# Patient Record
Sex: Male | Born: 1990 | Race: White | Hispanic: No | Marital: Single | Smoking: Never smoker
Health system: Southern US, Community
[De-identification: ages and names within clinical notes are randomized; demographics above are authoritative.]

## PROBLEM LIST (undated history)

## (undated) DIAGNOSIS — K219 Gastro-esophageal reflux disease without esophagitis: Secondary | ICD-10-CM

## (undated) DIAGNOSIS — D649 Anemia, unspecified: Secondary | ICD-10-CM

## (undated) DIAGNOSIS — G8929 Other chronic pain: Secondary | ICD-10-CM

## (undated) DIAGNOSIS — Z87442 Personal history of urinary calculi: Secondary | ICD-10-CM

## (undated) DIAGNOSIS — R51 Headache: Secondary | ICD-10-CM

## (undated) DIAGNOSIS — I10 Essential (primary) hypertension: Secondary | ICD-10-CM

## (undated) DIAGNOSIS — K922 Gastrointestinal hemorrhage, unspecified: Secondary | ICD-10-CM

## (undated) DIAGNOSIS — F329 Major depressive disorder, single episode, unspecified: Secondary | ICD-10-CM

## (undated) DIAGNOSIS — R519 Headache, unspecified: Secondary | ICD-10-CM

## (undated) DIAGNOSIS — G43909 Migraine, unspecified, not intractable, without status migrainosus: Secondary | ICD-10-CM

## (undated) DIAGNOSIS — F419 Anxiety disorder, unspecified: Secondary | ICD-10-CM

## (undated) DIAGNOSIS — E669 Obesity, unspecified: Secondary | ICD-10-CM

## (undated) DIAGNOSIS — J189 Pneumonia, unspecified organism: Secondary | ICD-10-CM

## (undated) DIAGNOSIS — J45909 Unspecified asthma, uncomplicated: Secondary | ICD-10-CM

## (undated) DIAGNOSIS — Z9109 Other allergy status, other than to drugs and biological substances: Secondary | ICD-10-CM

## (undated) DIAGNOSIS — R079 Chest pain, unspecified: Secondary | ICD-10-CM

## (undated) DIAGNOSIS — IMO0001 Reserved for inherently not codable concepts without codable children: Secondary | ICD-10-CM

## (undated) DIAGNOSIS — F32A Depression, unspecified: Secondary | ICD-10-CM

## (undated) DIAGNOSIS — F319 Bipolar disorder, unspecified: Secondary | ICD-10-CM

## (undated) HISTORY — PX: FRACTURE SURGERY: SHX138

## (undated) HISTORY — PX: GASTRIC BYPASS: SHX52

---
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
## 2014-11-14 ENCOUNTER — Encounter: Payer: Self-pay | Admitting: Nurse Practitioner

## 2014-11-28 ENCOUNTER — Ambulatory Visit: Payer: Self-pay | Admitting: Nurse Practitioner

## 2014-11-28 ENCOUNTER — Emergency Department (HOSPITAL_COMMUNITY)
Admission: EM | Admit: 2014-11-28 | Discharge: 2014-11-28 | Disposition: A | Discharge status: Home / Self Care | Payer: Medicaid Other | Attending: Emergency Medicine | Admitting: Emergency Medicine

## 2014-11-28 ENCOUNTER — Emergency Department (HOSPITAL_COMMUNITY): Payer: Medicaid Other

## 2014-11-28 ENCOUNTER — Encounter (HOSPITAL_COMMUNITY): Payer: Self-pay | Admitting: Emergency Medicine

## 2014-11-28 DIAGNOSIS — Y92410 Unspecified street and highway as the place of occurrence of the external cause: Secondary | ICD-10-CM | POA: Diagnosis not present

## 2014-11-28 DIAGNOSIS — Z7982 Long term (current) use of aspirin: Secondary | ICD-10-CM | POA: Diagnosis not present

## 2014-11-28 DIAGNOSIS — S42401A Unspecified fracture of lower end of right humerus, initial encounter for closed fracture: Secondary | ICD-10-CM | POA: Diagnosis not present

## 2014-11-28 DIAGNOSIS — E669 Obesity, unspecified: Secondary | ICD-10-CM | POA: Insufficient documentation

## 2014-11-28 DIAGNOSIS — Z79899 Other long term (current) drug therapy: Secondary | ICD-10-CM | POA: Insufficient documentation

## 2014-11-28 DIAGNOSIS — Y9389 Activity, other specified: Secondary | ICD-10-CM | POA: Diagnosis not present

## 2014-11-28 DIAGNOSIS — I10 Essential (primary) hypertension: Secondary | ICD-10-CM | POA: Insufficient documentation

## 2014-11-28 DIAGNOSIS — S42301A Unspecified fracture of shaft of humerus, right arm, initial encounter for closed fracture: Secondary | ICD-10-CM

## 2014-11-28 DIAGNOSIS — Y998 Other external cause status: Secondary | ICD-10-CM | POA: Insufficient documentation

## 2014-11-28 DIAGNOSIS — S4991XA Unspecified injury of right shoulder and upper arm, initial encounter: Secondary | ICD-10-CM | POA: Diagnosis present

## 2014-11-28 HISTORY — DX: Obesity, unspecified: E66.9

## 2014-11-28 HISTORY — DX: Essential (primary) hypertension: I10

## 2014-11-28 MED ORDER — ONDANSETRON 4 MG PO TBDP
8.0000 mg | ORAL_TABLET | Freq: Once | ORAL | Status: AC
  Administered 2014-11-28: 8 mg via ORAL
  Filled 2014-11-28: qty 2

## 2014-11-28 MED ORDER — HYDROCODONE-ACETAMINOPHEN 5-325 MG PO TABS: 1.0000 | ORAL_TABLET | Freq: Four times a day (QID) | ORAL | Status: DC | PRN

## 2014-11-28 MED ORDER — OXYCODONE-ACETAMINOPHEN 5-325 MG PO TABS
1.0000 | ORAL_TABLET | Freq: Once | ORAL | Status: AC
  Administered 2014-11-28: 1 via ORAL
  Filled 2014-11-28: qty 1

## 2014-11-28 NOTE — ED Notes (Signed)
Pt monitored by pulse ox and bp cuff. 

## 2014-11-28 NOTE — ED Notes (Signed)
Pt returned to exam room from xray 

## 2014-11-28 NOTE — ED Notes (Addendum)
R arm elevated. Pt nauseated.

## 2014-11-28 NOTE — ED Notes (Signed)
Pt log rolled and no tenderness to cervical or back.
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
## 2014-11-28 NOTE — Progress Notes (Signed)
Orthopedic Tech Progress Note Patient Details:  Howard [name] [birth year] [id number]  Ortho Devices Type of Ortho Device: Ace wrap, Sling immobilizer, Coapt Ortho Device/Splint Location: rue Ortho Device/Splint Interventions: Application   Criselda Starke 11/28/2014, 2:04 PM
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
## 2014-11-28 NOTE — Discharge Instructions (Signed)
Please follow-up with orthopedic specialist immediately for further evaluation and management. Please monitor for new or worsening signs or symptoms return immediately if any present

## 2014-11-28 NOTE — ED Notes (Signed)
Ortho tech and RN at bedside to splint.

## 2014-11-29 ENCOUNTER — Other Ambulatory Visit: Payer: Self-pay | Admitting: Orthopedic Surgery

## 2014-11-29 ENCOUNTER — Encounter (HOSPITAL_COMMUNITY): Payer: Self-pay | Admitting: *Deleted

## 2014-11-29 NOTE — Progress Notes (Signed)
Dr. Jacklynn Bue, anesthesia, made aware of pt C/O chest pain for 4 years; EKG DOS per MD.

## 2014-11-29 NOTE — Progress Notes (Signed)
Pt stated that he has been experiencing chest pain for the last 4 years and denies being under the care of a cardiologist, having a stress test , echo and cardiac cath done. Pt stated, " they said that I was too young to have any tests done but I had an EKG done about 2-3 years ago at Dr. Wooldridge Copa at [place]."  According to pt grandmother ( pt consented) , " he takes Aspirin sometimes at night when he has chest pain. " Pt made aware to stop NSAID's , otc vitamins and herbal medications." Please measure neck and include BMI to complete Sleep Apnea screening on DOS. " According to pt grandmother, pt no longer takes Jordan because " it didn't agree with him, it made him hallucinate."

## 2014-11-30 ENCOUNTER — Ambulatory Visit (HOSPITAL_COMMUNITY): Payer: Medicaid Other | Admitting: Anesthesiology

## 2014-11-30 ENCOUNTER — Encounter (HOSPITAL_COMMUNITY)
Admission: RE | Disposition: A | Discharge status: Home / Self Care | Payer: Self-pay | Source: Ambulatory Visit | Attending: Orthopedic Surgery

## 2014-11-30 ENCOUNTER — Encounter (HOSPITAL_COMMUNITY): Payer: Self-pay | Admitting: *Deleted

## 2014-11-30 ENCOUNTER — Observation Stay (HOSPITAL_COMMUNITY)
Admission: RE | Admit: 2014-11-30 | Discharge: 2014-12-01 | Disposition: A | Discharge status: Home / Self Care | Payer: Medicaid Other | Source: Ambulatory Visit | Attending: Orthopedic Surgery | Admitting: Orthopedic Surgery

## 2014-11-30 ENCOUNTER — Ambulatory Visit (HOSPITAL_COMMUNITY): Payer: Medicaid Other

## 2014-11-30 DIAGNOSIS — J45909 Unspecified asthma, uncomplicated: Secondary | ICD-10-CM | POA: Insufficient documentation

## 2014-11-30 DIAGNOSIS — K219 Gastro-esophageal reflux disease without esophagitis: Secondary | ICD-10-CM | POA: Insufficient documentation

## 2014-11-30 DIAGNOSIS — S42301A Unspecified fracture of shaft of humerus, right arm, initial encounter for closed fracture: Secondary | ICD-10-CM | POA: Diagnosis not present

## 2014-11-30 DIAGNOSIS — Z7982 Long term (current) use of aspirin: Secondary | ICD-10-CM | POA: Insufficient documentation

## 2014-11-30 DIAGNOSIS — I10 Essential (primary) hypertension: Secondary | ICD-10-CM | POA: Insufficient documentation

## 2014-11-30 DIAGNOSIS — Z6841 Body Mass Index (BMI) 40.0 and over, adult: Secondary | ICD-10-CM | POA: Diagnosis not present

## 2014-11-30 DIAGNOSIS — S42343A Displaced spiral fracture of shaft of humerus, unspecified arm, initial encounter for closed fracture: Secondary | ICD-10-CM

## 2014-11-30 DIAGNOSIS — Z23 Encounter for immunization: Secondary | ICD-10-CM | POA: Diagnosis not present

## 2014-11-30 DIAGNOSIS — S42341A Displaced spiral fracture of shaft of humerus, right arm, initial encounter for closed fracture: Secondary | ICD-10-CM | POA: Diagnosis present

## 2014-11-30 DIAGNOSIS — X58XXXA Exposure to other specified factors, initial encounter: Secondary | ICD-10-CM | POA: Insufficient documentation

## 2014-11-30 DIAGNOSIS — F319 Bipolar disorder, unspecified: Secondary | ICD-10-CM | POA: Insufficient documentation

## 2014-11-30 DIAGNOSIS — R079 Chest pain, unspecified: Secondary | ICD-10-CM | POA: Diagnosis not present

## 2014-11-30 DIAGNOSIS — Z79899 Other long term (current) drug therapy: Secondary | ICD-10-CM | POA: Diagnosis not present

## 2014-11-30 HISTORY — DX: Headache, unspecified: R51.9

## 2014-11-30 HISTORY — DX: Other chronic pain: G89.29

## 2014-11-30 HISTORY — DX: Headache: R51

## 2014-11-30 HISTORY — DX: Chest pain, unspecified: R07.9

## 2014-11-30 HISTORY — DX: Gastro-esophageal reflux disease without esophagitis: K21.9

## 2014-11-30 HISTORY — PX: ORIF HUMERUS FRACTURE: SHX2126

## 2014-11-30 HISTORY — DX: Other allergy status, other than to drugs and biological substances: Z91.09

## 2014-11-30 HISTORY — DX: Reserved for inherently not codable concepts without codable children: IMO0001

## 2014-11-30 LAB — BASIC METABOLIC PANEL
Anion gap: 10 (ref 5–15)
BUN: 12 mg/dL (ref 6–20)
CALCIUM: 9 mg/dL (ref 8.9–10.3)
CO2: 22 mmol/L (ref 22–32)
CREATININE: 0.98 mg/dL (ref 0.61–1.24)
Chloride: 103 mmol/L (ref 101–111)
GFR calc non Af Amer: >60 mL/min (ref >60)
Glucose, Bld: 98 mg/dL (ref 65–99)
POTASSIUM: 3.3 mmol/L — AB (ref 3.5–5.1)
Sodium: 135 mmol/L (ref 135–145)

## 2014-11-30 LAB — CBC
HEMATOCRIT: 40.2 % (ref 39.0–52.0)
Hemoglobin: 13.4 g/dL (ref 13.0–17.0)
MCH: 28.2 pg (ref 26.0–34.0)
MCHC: 33.3 g/dL (ref 30.0–36.0)
MCV: 84.5 fL (ref 78.0–100.0)
Platelets: 297 10*3/uL (ref 150–400)
RBC: 4.76 MIL/uL (ref 4.22–5.81)
RDW: 13.7 % (ref 11.5–15.5)
WBC: 15.2 10*3/uL — AB (ref 4.0–10.5)

## 2014-11-30 SURGERY — OPEN REDUCTION INTERNAL FIXATION (ORIF) PROXIMAL HUMERUS FRACTURE
Anesthesia: Regional | Site: Arm Upper | Laterality: Right

## 2014-11-30 MED ORDER — FENTANYL CITRATE (PF) 100 MCG/2ML IJ SOLN
INTRAMUSCULAR | Status: DC | PRN
  Administered 2014-11-30 (×4): 50 ug via INTRAVENOUS
  Administered 2014-11-30: 100 ug via INTRAVENOUS

## 2014-11-30 MED ORDER — METOCLOPRAMIDE HCL 5 MG/ML IJ SOLN
5.0000 mg | Freq: Three times a day (TID) | INTRAMUSCULAR | Status: DC | PRN
  Administered 2014-11-30: 10 mg via INTRAVENOUS

## 2014-11-30 MED ORDER — ASPIRIN EC 325 MG PO TBEC
325.0000 mg | DELAYED_RELEASE_TABLET | Freq: Two times a day (BID) | ORAL | Status: DC
  Administered 2014-11-30: 325 mg via ORAL
  Filled 2014-11-30: qty 1

## 2014-11-30 MED ORDER — ONDANSETRON HCL 4 MG/2ML IJ SOLN
4.0000 mg | Freq: Four times a day (QID) | INTRAMUSCULAR | Status: DC | PRN
  Administered 2014-11-30: 4 mg via INTRAVENOUS

## 2014-11-30 MED ORDER — PNEUMOCOCCAL VAC POLYVALENT 25 MCG/0.5ML IJ INJ
0.5000 mL | INJECTION | INTRAMUSCULAR | Status: AC
  Administered 2014-12-01: 0.5 mL via INTRAMUSCULAR
  Filled 2014-11-30: qty 0.5

## 2014-11-30 MED ORDER — METOCLOPRAMIDE HCL 5 MG PO TABS: 5.0000 mg | ORAL_TABLET | Freq: Three times a day (TID) | ORAL | Status: DC | PRN

## 2014-11-30 MED ORDER — PROMETHAZINE HCL 25 MG/ML IJ SOLN: 6.2500 mg | INTRAMUSCULAR | Status: DC | PRN

## 2014-11-30 MED ORDER — BISACODYL 5 MG PO TBEC: 5.0000 mg | DELAYED_RELEASE_TABLET | Freq: Every day | ORAL | Status: DC | PRN

## 2014-11-30 MED ORDER — CEFAZOLIN SODIUM-DEXTROSE 2-3 GM-% IV SOLR
2.0000 g | Freq: Four times a day (QID) | INTRAVENOUS | Status: AC
  Administered 2014-11-30 – 2014-12-01 (×3): 2 g via INTRAVENOUS
  Filled 2014-11-30 (×3): qty 50

## 2014-11-30 MED ORDER — BUPIVACAINE-EPINEPHRINE (PF) 0.5% -1:200000 IJ SOLN
INTRAMUSCULAR | Status: DC | PRN
  Administered 2014-11-30: 30 mL via PERINEURAL

## 2014-11-30 MED ORDER — PROPOFOL 10 MG/ML IV BOLUS
INTRAVENOUS | Status: AC
  Filled 2014-11-30: qty 20

## 2014-11-30 MED ORDER — MORPHINE SULFATE 2 MG/ML IJ SOLN: 2.0000 mg | INTRAMUSCULAR | Status: DC | PRN

## 2014-11-30 MED ORDER — KETOROLAC TROMETHAMINE 30 MG/ML IJ SOLN: 30.0000 mg | Freq: Once | INTRAMUSCULAR | Status: DC | PRN

## 2014-11-30 MED ORDER — HYDROCHLOROTHIAZIDE 25 MG PO TABS
25.0000 mg | ORAL_TABLET | Freq: Every day | ORAL | Status: DC
  Administered 2014-11-30 – 2014-12-01 (×2): 25 mg via ORAL
  Filled 2014-11-30 (×2): qty 1

## 2014-11-30 MED ORDER — FENTANYL CITRATE (PF) 250 MCG/5ML IJ SOLN
INTRAMUSCULAR | Status: AC
  Filled 2014-11-30: qty 5

## 2014-11-30 MED ORDER — NEOSTIGMINE METHYLSULFATE 10 MG/10ML IV SOLN
INTRAVENOUS | Status: DC | PRN
  Administered 2014-11-30: 3 mg via INTRAVENOUS

## 2014-11-30 MED ORDER — LACTATED RINGERS IV SOLN
INTRAVENOUS | Status: DC | PRN
  Administered 2014-11-30 (×2): via INTRAVENOUS

## 2014-11-30 MED ORDER — POLYETHYLENE GLYCOL 3350 17 G PO PACK: 17.0000 g | PACK | Freq: Every day | ORAL | Status: DC | PRN

## 2014-11-30 MED ORDER — DEXAMETHASONE SODIUM PHOSPHATE 4 MG/ML IJ SOLN
INTRAMUSCULAR | Status: DC | PRN
  Administered 2014-11-30: 8 mg via INTRAVENOUS

## 2014-11-30 MED ORDER — DOCUSATE SODIUM 100 MG PO CAPS
100.0000 mg | ORAL_CAPSULE | Freq: Two times a day (BID) | ORAL | Status: DC
  Administered 2014-11-30 – 2014-12-01 (×2): 100 mg via ORAL
  Filled 2014-11-30 (×2): qty 1

## 2014-11-30 MED ORDER — ONDANSETRON HCL 4 MG PO TABS: 4.0000 mg | ORAL_TABLET | Freq: Four times a day (QID) | ORAL | Status: DC | PRN

## 2014-11-30 MED ORDER — DEXAMETHASONE SODIUM PHOSPHATE 4 MG/ML IJ SOLN
INTRAMUSCULAR | Status: AC
  Filled 2014-11-30: qty 2

## 2014-11-30 MED ORDER — MIDAZOLAM HCL 2 MG/2ML IJ SOLN
INTRAMUSCULAR | Status: AC
  Filled 2014-11-30: qty 4

## 2014-11-30 MED ORDER — SODIUM CHLORIDE 0.9 % IV SOLN
INTRAVENOUS | Status: DC
  Administered 2014-11-30: 20:00:00 via INTRAVENOUS

## 2014-11-30 MED ORDER — ONDANSETRON HCL 4 MG/2ML IJ SOLN
INTRAMUSCULAR | Status: DC | PRN
  Administered 2014-11-30: 4 mg via INTRAVENOUS

## 2014-11-30 MED ORDER — ROCURONIUM BROMIDE 100 MG/10ML IV SOLN
INTRAVENOUS | Status: DC | PRN
  Administered 2014-11-30: 50 mg via INTRAVENOUS

## 2014-11-30 MED ORDER — PHENOL 1.4 % MT LIQD: 1.0000 | OROMUCOSAL | Status: DC | PRN

## 2014-11-30 MED ORDER — CEFAZOLIN SODIUM-DEXTROSE 2-3 GM-% IV SOLR: 2.0000 g | INTRAVENOUS | Status: DC

## 2014-11-30 MED ORDER — MIDAZOLAM HCL 5 MG/5ML IJ SOLN
INTRAMUSCULAR | Status: DC | PRN
  Administered 2014-11-30: 2 mg via INTRAVENOUS

## 2014-11-30 MED ORDER — OXYCODONE HCL 5 MG PO TABS: 5.0000 mg | ORAL_TABLET | Freq: Once | ORAL | Status: DC | PRN

## 2014-11-30 MED ORDER — OXYCODONE HCL 5 MG PO TABS
5.0000 mg | ORAL_TABLET | ORAL | Status: DC | PRN
  Administered 2014-11-30: 10 mg via ORAL
  Administered 2014-12-01: 5 mg via ORAL
  Administered 2014-12-01 (×2): 10 mg via ORAL
  Filled 2014-11-30 (×4): qty 2
  Filled 2014-11-30: qty 1

## 2014-11-30 MED ORDER — GLYCOPYRROLATE 0.2 MG/ML IJ SOLN
INTRAMUSCULAR | Status: DC | PRN
  Administered 2014-11-30: 0.4 mg via INTRAVENOUS

## 2014-11-30 MED ORDER — AMLODIPINE BESY-BENAZEPRIL HCL 5-10 MG PO CAPS: 1.0000 | ORAL_CAPSULE | Freq: Every day | ORAL | Status: DC

## 2014-11-30 MED ORDER — ASPIRIN EC 325 MG PO TBEC: 325.0000 mg | DELAYED_RELEASE_TABLET | Freq: Two times a day (BID) | ORAL | Status: DC

## 2014-11-30 MED ORDER — ACETAMINOPHEN 325 MG PO TABS: 650.0000 mg | ORAL_TABLET | Freq: Four times a day (QID) | ORAL | Status: DC | PRN

## 2014-11-30 MED ORDER — METOCLOPRAMIDE HCL 5 MG/ML IJ SOLN
INTRAMUSCULAR | Status: AC
  Filled 2014-11-30: qty 2

## 2014-11-30 MED ORDER — AMLODIPINE BESYLATE 5 MG PO TABS
5.0000 mg | ORAL_TABLET | Freq: Every day | ORAL | Status: DC
  Administered 2014-11-30 – 2014-12-01 (×2): 5 mg via ORAL
  Filled 2014-11-30 (×2): qty 1

## 2014-11-30 MED ORDER — BENAZEPRIL HCL 20 MG PO TABS
10.0000 mg | ORAL_TABLET | Freq: Every day | ORAL | Status: DC
  Administered 2014-11-30 – 2014-12-01 (×2): 10 mg via ORAL
  Filled 2014-11-30 (×2): qty 1

## 2014-11-30 MED ORDER — ALUMINUM HYDROXIDE GEL 320 MG/5ML PO SUSP
15.0000 mL | ORAL | Status: DC | PRN
  Filled 2014-11-30: qty 30

## 2014-11-30 MED ORDER — SUCCINYLCHOLINE CHLORIDE 20 MG/ML IJ SOLN
INTRAMUSCULAR | Status: DC | PRN
  Administered 2014-11-30: 120 mg via INTRAVENOUS

## 2014-11-30 MED ORDER — DIPHENHYDRAMINE HCL 12.5 MG/5ML PO ELIX: 12.5000 mg | ORAL_SOLUTION | ORAL | Status: DC | PRN

## 2014-11-30 MED ORDER — CEFAZOLIN SODIUM 1-5 GM-% IV SOLN
1.0000 g | INTRAVENOUS | Status: DC
  Filled 2014-11-30: qty 50

## 2014-11-30 MED ORDER — PANTOPRAZOLE SODIUM 40 MG PO TBEC
40.0000 mg | DELAYED_RELEASE_TABLET | Freq: Every day | ORAL | Status: DC
  Administered 2014-11-30 – 2014-12-01 (×2): 40 mg via ORAL
  Filled 2014-11-30 (×2): qty 1

## 2014-11-30 MED ORDER — OXYCODONE HCL 5 MG/5ML PO SOLN: 5.0000 mg | Freq: Once | ORAL | Status: DC | PRN

## 2014-11-30 MED ORDER — HYDROMORPHONE HCL 1 MG/ML IJ SOLN: 0.2500 mg | INTRAMUSCULAR | Status: DC | PRN

## 2014-11-30 MED ORDER — FLEET ENEMA 7-19 GM/118ML RE ENEM: 1.0000 | ENEMA | Freq: Once | RECTAL | Status: AC | PRN

## 2014-11-30 MED ORDER — 0.9 % SODIUM CHLORIDE (POUR BTL) OPTIME
TOPICAL | Status: DC | PRN
  Administered 2014-11-30: 1000 mL

## 2014-11-30 MED ORDER — LIDOCAINE HCL (CARDIAC) 20 MG/ML IV SOLN
INTRAVENOUS | Status: AC
  Filled 2014-11-30: qty 5

## 2014-11-30 MED ORDER — PROPOFOL 10 MG/ML IV BOLUS
INTRAVENOUS | Status: DC | PRN
  Administered 2014-11-30: 300 mg via INTRAVENOUS

## 2014-11-30 MED ORDER — MENTHOL 3 MG MT LOZG: 1.0000 | LOZENGE | OROMUCOSAL | Status: DC | PRN

## 2014-11-30 MED ORDER — POVIDONE-IODINE 7.5 % EX SOLN
Freq: Once | CUTANEOUS | Status: DC
  Filled 2014-11-30: qty 118

## 2014-11-30 MED ORDER — ROCURONIUM BROMIDE 50 MG/5ML IV SOLN
INTRAVENOUS | Status: AC
  Filled 2014-11-30: qty 1

## 2014-11-30 MED ORDER — ONDANSETRON HCL 4 MG/2ML IJ SOLN
INTRAMUSCULAR | Status: AC
  Filled 2014-11-30: qty 2

## 2014-11-30 MED ORDER — CEFAZOLIN SODIUM-DEXTROSE 2-3 GM-% IV SOLR
INTRAVENOUS | Status: AC
  Administered 2014-11-30: 2 g via INTRAVENOUS
  Administered 2014-11-30: 1 g via INTRAVENOUS
  Filled 2014-11-30: qty 50

## 2014-11-30 MED ORDER — ACETAMINOPHEN 650 MG RE SUPP: 650.0000 mg | Freq: Four times a day (QID) | RECTAL | Status: DC | PRN

## 2014-11-30 SURGICAL SUPPLY — 78 items
BANDAGE ELASTIC 4 VELCRO ST LF (GAUZE/BANDAGES/DRESSINGS) ×3 IMPLANT
BANDAGE ELASTIC 6 VELCRO ST LF (GAUZE/BANDAGES/DRESSINGS) ×3 IMPLANT
BIT DRILL 2.5X2.75 QC CALB (BIT) ×6 IMPLANT
BIT DRILL 3.5X5.5 QC CALB (BIT) ×3 IMPLANT
BIT DRILL CALIBRATED 2.7 (BIT) ×2 IMPLANT
BIT DRILL CALIBRATED 2.7MM (BIT) ×1
CHLORAPREP W/TINT 26ML (MISCELLANEOUS) ×3 IMPLANT
CLOSURE WOUND 1/2 X4 (GAUZE/BANDAGES/DRESSINGS)
COVER MAYO STAND STRL (DRAPES) ×3 IMPLANT
COVER SURGICAL LIGHT HANDLE (MISCELLANEOUS) ×6 IMPLANT
DRAPE C-ARM 42X72 X-RAY (DRAPES) ×3 IMPLANT
DRAPE EXTREMITY T 121X128X90 (DRAPE) ×3 IMPLANT
DRAPE IMP U-DRAPE 54X76 (DRAPES) IMPLANT
DRAPE INCISE IOBAN 66X45 STRL (DRAPES) ×3 IMPLANT
DRAPE OEC MINIVIEW 54X84 (DRAPES) ×3 IMPLANT
DRAPE PROXIMA HALF (DRAPES) ×3 IMPLANT
DRAPE SURG 17X23 STRL (DRAPES) ×3 IMPLANT
DRAPE U-SHAPE 47X51 STRL (DRAPES) ×3 IMPLANT
DRSG ADAPTIC 3X8 NADH LF (GAUZE/BANDAGES/DRESSINGS) ×3 IMPLANT
DRSG EMULSION OIL 3X3 NADH (GAUZE/BANDAGES/DRESSINGS) IMPLANT
DRSG MEPILEX BORDER 4X8 (GAUZE/BANDAGES/DRESSINGS) IMPLANT
DRSG PAD ABDOMINAL 8X10 ST (GAUZE/BANDAGES/DRESSINGS) ×3 IMPLANT
ELECT REM PT RETURN 9FT ADLT (ELECTROSURGICAL) ×3
ELECTRODE REM PT RTRN 9FT ADLT (ELECTROSURGICAL) ×1 IMPLANT
GAUZE SPONGE 4X4 12PLY STRL (GAUZE/BANDAGES/DRESSINGS) IMPLANT
GLOVE BIO SURGEON STRL SZ7 (GLOVE) ×3 IMPLANT
GLOVE BIO SURGEON STRL SZ7.5 (GLOVE) ×3 IMPLANT
GLOVE BIOGEL PI IND STRL 7.0 (GLOVE) ×1 IMPLANT
GLOVE BIOGEL PI IND STRL 8 (GLOVE) ×1 IMPLANT
GLOVE BIOGEL PI INDICATOR 7.0 (GLOVE) ×2
GLOVE BIOGEL PI INDICATOR 8 (GLOVE) ×2
GOWN STRL REUS W/ TWL LRG LVL3 (GOWN DISPOSABLE) ×3 IMPLANT
GOWN STRL REUS W/ TWL XL LVL3 (GOWN DISPOSABLE) ×1 IMPLANT
GOWN STRL REUS W/TWL LRG LVL3 (GOWN DISPOSABLE) ×6
GOWN STRL REUS W/TWL XL LVL3 (GOWN DISPOSABLE) ×2
KIT BASIN OR (CUSTOM PROCEDURE TRAY) ×3 IMPLANT
KIT ROOM TURNOVER OR (KITS) ×3 IMPLANT
LOOP VESSEL MAXI BLUE (MISCELLANEOUS) ×3 IMPLANT
MANIFOLD NEPTUNE II (INSTRUMENTS) ×3 IMPLANT
NDL SUT .5 MAYO 1.404X.05X (NEEDLE) ×1 IMPLANT
NDL SUT 2 .5 CRC MAYO 1.732X (NEEDLE) ×1 IMPLANT
NEEDLE 22X1 1/2 (OR ONLY) (NEEDLE) IMPLANT
NEEDLE MAYO TAPER (NEEDLE) ×4
NS IRRIG 1000ML POUR BTL (IV SOLUTION) ×3 IMPLANT
PACK SHOULDER (CUSTOM PROCEDURE TRAY) ×3 IMPLANT
PACK UNIVERSAL I (CUSTOM PROCEDURE TRAY) ×3 IMPLANT
PAD ABD 8X10 STRL (GAUZE/BANDAGES/DRESSINGS) ×6 IMPLANT
PAD ARMBOARD 7.5X6 YLW CONV (MISCELLANEOUS) ×6 IMPLANT
PAD CAST 4YDX4 CTTN HI CHSV (CAST SUPPLIES) ×2 IMPLANT
PADDING CAST COTTON 4X4 STRL (CAST SUPPLIES) ×4
PADDING CAST COTTON 6X4 STRL (CAST SUPPLIES) ×6 IMPLANT
PLATE HUMERUS 21H (Plate) ×3 IMPLANT
SCREW CORTICAL 3.5MM 24MM (Screw) ×12 IMPLANT
SCREW CORTICAL 3.5MM 26MM (Screw) ×6 IMPLANT
SCREW LOCK CORT STAR 3.5X16 (Screw) ×6 IMPLANT
SCREW LOCK CORT STAR 3.5X18 (Screw) ×3 IMPLANT
SCREW LOCK CORT STAR 3.5X22 (Screw) ×12 IMPLANT
SLING ARM IMMOBILIZER XL (CAST SUPPLIES) ×3 IMPLANT
SPLINT PLASTER CAST XFAST 5X30 (CAST SUPPLIES) ×1 IMPLANT
SPLINT PLASTER XFAST SET 5X30 (CAST SUPPLIES) ×2
SPONGE GAUZE 4X4 12PLY STER LF (GAUZE/BANDAGES/DRESSINGS) ×3 IMPLANT
SPONGE LAP 18X18 X RAY DECT (DISPOSABLE) ×15 IMPLANT
STAPLER VISISTAT 35W (STAPLE) ×3 IMPLANT
STRIP CLOSURE SKIN 1/2X4 (GAUZE/BANDAGES/DRESSINGS) IMPLANT
SUCTION FRAZIER TIP 10 FR DISP (SUCTIONS) ×6 IMPLANT
SUPPORT WRAP ARM LG (MISCELLANEOUS) ×3 IMPLANT
SUT BONE WAX W31G (SUTURE) IMPLANT
SUT FIBERWIRE #2 38 T-5 BLUE (SUTURE) ×6
SUT VIC AB 0 CT1 27 (SUTURE) ×4
SUT VIC AB 0 CT1 27XBRD ANBCTR (SUTURE) ×2 IMPLANT
SUT VIC AB 2-0 CT1 27 (SUTURE) ×8
SUT VIC AB 2-0 CT1 TAPERPNT 27 (SUTURE) ×4 IMPLANT
SUTURE FIBERWR #2 38 T-5 BLUE (SUTURE) ×2 IMPLANT
SYR CONTROL 10ML LL (SYRINGE) IMPLANT
TOWEL OR 17X24 6PK STRL BLUE (TOWEL DISPOSABLE) ×3 IMPLANT
TOWEL OR 17X26 10 PK STRL BLUE (TOWEL DISPOSABLE) ×3 IMPLANT
WATER STERILE IRR 1000ML POUR (IV SOLUTION) ×3 IMPLANT
YANKAUER SUCT BULB TIP NO VENT (SUCTIONS) ×3 IMPLANT

## 2014-11-30 NOTE — Anesthesia Postprocedure Evaluation (Signed)
  Anesthesia Post-op Note  Patient: Howard [name]  Procedure(s) Performed: Procedure(s): OPEN REDUCTION INTERNAL FIXATION (ORIF) RIGHT Distal HUMERUS FRACTURE (Right)  Patient Location: PACU  Anesthesia Type:GA combined with regional for post-op pain  Level of Consciousness: awake and alert   Airway and Oxygen Therapy: Patient Spontanous Breathing  Post-op Pain: none  Post-op Assessment: Post-op Vital signs reviewed              Post-op Vital Signs: Reviewed  Last Vitals:  Filed Vitals:   11/30/14 1310  BP: 137/61  Pulse: 122  Temp: 37.1 C  Resp: 18    Complications: No apparent anesthesia complications
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
## 2014-11-30 NOTE — Anesthesia Preprocedure Evaluation (Addendum)
Anesthesia Evaluation  Patient identified by MRN, date of birth, ID band Patient awake    Reviewed: Allergy & Precautions, NPO status , Patient's Chart, lab work & pertinent test results  History of Anesthesia Complications Negative for: history of anesthetic complications  Airway Mallampati: III  TM Distance: >3 FB Neck ROM: Full    Dental  (+) Dental Advisory Given, Teeth Intact   Pulmonary neg pulmonary ROS, shortness of breath and with exertion, asthma (as a child) ,  breath sounds clear to auscultation        Cardiovascular hypertension, Pt. on medications Rhythm:Regular Rate:Normal     Neuro/Psych  Headaches, PSYCHIATRIC DISORDERS Bipolar Disorder negative neurological ROS     GI/Hepatic Neg liver ROS, GERD-  Medicated and Controlled,  Endo/Other  Morbid obesity  Renal/GU negative Renal ROS     Musculoskeletal negative musculoskeletal ROS (+)   Abdominal   Peds negative pediatric ROS (+)  Hematology negative hematology ROS (+)   Anesthesia Other Findings   Reproductive/Obstetrics negative OB ROS                          Anesthesia Physical Anesthesia Plan  ASA: III  Anesthesia Plan: General and Regional   Post-op Pain Management: GA combined w/ Regional for post-op pain   Induction: Intravenous  Airway Management Planned: Oral ETT  Additional Equipment:   Intra-op Plan:   Post-operative Plan: Extubation in OR  Informed Consent: I have reviewed the patients History and Physical, chart, labs and discussed the procedure including the risks, benefits and alternatives for the proposed anesthesia with the patient or authorized representative who has indicated his/her understanding and acceptance.   Dental advisory given  Plan Discussed with: CRNA  Anesthesia Plan Comments:         Anesthesia Quick Evaluation

## 2014-11-30 NOTE — Anesthesia Procedure Notes (Signed)
Anesthesia Regional Block:  Supraclavicular block  Pre-Anesthetic Checklist: ,, timeout performed, Correct Patient, Correct Site, Correct Laterality, Correct Procedure, Correct Position, site marked, Risks and benefits discussed,  Surgical consent,  Pre-op evaluation,  At surgeon's request and post-op pain management  Laterality: Right  Prep: chloraprep       Needles:  Injection technique: Single-shot  Needle Type: Echogenic Stimulator Needle     Needle Length: 9cm 9 cm Needle Gauge: 21 and 21 G    Additional Needles:  Procedures: ultrasound guided (picture in chart) and nerve stimulator Supraclavicular block  Nerve Stimulator or Paresthesia:  Response: biceps, 0.5 mA,  Response: finger flexion, 0.5 mA,   Additional Responses:   Narrative:  Start time: 11/30/2014 7:10 AM End time: 11/30/2014 7:20 AM Injection made incrementally with aspirations every 5 mL.  Performed by: Personally  Anesthesiologist: Marcene Duos  Additional Notes: Risks, benefits and alternative to block explained extensively.  Patient tolerated procedure well, without complications.
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
## 2014-11-30 NOTE — Discharge Instructions (Signed)
Discharge Instructions after Open Shoulder Repair  A sling has been provided for you. Remain in your sling at all times. This includes sleeping in your sling.  Use ice on the shoulder intermittently over the first 48 hours after surgery.  Pain medicine has been prescribed for you.  Use your medicine liberally over the first 48 hours, and then you can begin to taper your use. You may take Extra Strength Tylenol or Tylenol only in place of the pain pills. DO NOT take ANY nonsteroidal anti-inflammatory pain medications: Advil, Motrin, Ibuprofen, Aleve, Naproxen or Naprosyn.  Keep splint on until your first visit with Dr. Ave Filter, it must stay dry. Wrap it with large bag if you need to shower. Take one aspirin, a day for 2 weeks after surgery, unless you have an aspirin sensitivity/ allergy or asthma.   Please call [phone] during normal business hours or [phone] after hours for any problems. Including the following:  - excessive redness of the incisions - drainage for more than 4 days - fever of more than 101.5 F  *Please note that pain medications will not be refilled after hours or on weekends.

## 2014-11-30 NOTE — Transfer of Care (Signed)
Immediate Anesthesia Transfer of Care Note  Patient: Howard [name]  Procedure(s) Performed: Procedure(s): OPEN REDUCTION INTERNAL FIXATION (ORIF) RIGHT Distal HUMERUS FRACTURE (Right)  Patient Location: PACU  Anesthesia Type:General and Regional  Level of Consciousness: awake, alert , oriented and patient cooperative  Airway & Oxygen Therapy: Patient Spontanous Breathing and Patient connected to face mask oxygen  Post-op Assessment: Report given to RN and Post -op Vital signs reviewed and stable  Post vital signs: Reviewed and stable  Last Vitals:  Filed Vitals:   11/30/14 0609  BP: 158/90  Pulse: 119  Temp: 36.7 C  Resp: 18    Complications: No apparent anesthesia complications

## 2014-12-01 ENCOUNTER — Encounter (HOSPITAL_COMMUNITY): Payer: Self-pay | Admitting: Orthopedic Surgery

## 2014-12-01 DIAGNOSIS — S42301A Unspecified fracture of shaft of humerus, right arm, initial encounter for closed fracture: Secondary | ICD-10-CM | POA: Diagnosis not present

## 2014-12-01 LAB — CBC
HCT: 36.3 % — ABNORMAL LOW (ref 39.0–52.0)
Hemoglobin: 12.1 g/dL — ABNORMAL LOW (ref 13.0–17.0)
MCH: 28.2 pg (ref 26.0–34.0)
MCHC: 33.3 g/dL (ref 30.0–36.0)
MCV: 84.6 fL (ref 78.0–100.0)
Platelets: 359 10*3/uL (ref 150–400)
RBC: 4.29 MIL/uL (ref 4.22–5.81)
RDW: 13.8 % (ref 11.5–15.5)
WBC: 19.4 10*3/uL — AB (ref 4.0–10.5)

## 2014-12-01 MED ORDER — OXYCODONE-ACETAMINOPHEN 5-325 MG PO TABS: 1.0000 | ORAL_TABLET | ORAL | Status: DC | PRN

## 2014-12-01 MED ORDER — DOCUSATE SODIUM 100 MG PO CAPS: 100.0000 mg | ORAL_CAPSULE | Freq: Three times a day (TID) | ORAL | Status: DC | PRN
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
## 2014-12-06 ENCOUNTER — Inpatient Hospital Stay (HOSPITAL_COMMUNITY)
Admission: EM | Admit: 2014-12-06 | Discharge: 2014-12-07 | DRG: 378 | Disposition: A | Discharge status: Home / Self Care | Payer: Medicaid Other | Attending: Oncology | Admitting: Oncology

## 2014-12-06 ENCOUNTER — Encounter (HOSPITAL_COMMUNITY): Payer: Self-pay | Admitting: Family Medicine

## 2014-12-06 DIAGNOSIS — G8929 Other chronic pain: Secondary | ICD-10-CM | POA: Diagnosis present

## 2014-12-06 DIAGNOSIS — K219 Gastro-esophageal reflux disease without esophagitis: Secondary | ICD-10-CM | POA: Diagnosis present

## 2014-12-06 DIAGNOSIS — I10 Essential (primary) hypertension: Secondary | ICD-10-CM | POA: Diagnosis present

## 2014-12-06 DIAGNOSIS — G43909 Migraine, unspecified, not intractable, without status migrainosus: Secondary | ICD-10-CM | POA: Diagnosis present

## 2014-12-06 DIAGNOSIS — Z8249 Family history of ischemic heart disease and other diseases of the circulatory system: Secondary | ICD-10-CM

## 2014-12-06 DIAGNOSIS — E869 Volume depletion, unspecified: Secondary | ICD-10-CM | POA: Diagnosis present

## 2014-12-06 DIAGNOSIS — K449 Diaphragmatic hernia without obstruction or gangrene: Secondary | ICD-10-CM | POA: Diagnosis present

## 2014-12-06 DIAGNOSIS — F319 Bipolar disorder, unspecified: Secondary | ICD-10-CM | POA: Diagnosis present

## 2014-12-06 DIAGNOSIS — Z9889 Other specified postprocedural states: Secondary | ICD-10-CM | POA: Diagnosis not present

## 2014-12-06 DIAGNOSIS — K922 Gastrointestinal hemorrhage, unspecified: Secondary | ICD-10-CM

## 2014-12-06 DIAGNOSIS — Z6841 Body Mass Index (BMI) 40.0 and over, adult: Secondary | ICD-10-CM | POA: Diagnosis not present

## 2014-12-06 DIAGNOSIS — Z79899 Other long term (current) drug therapy: Secondary | ICD-10-CM | POA: Diagnosis not present

## 2014-12-06 DIAGNOSIS — R000 Tachycardia, unspecified: Secondary | ICD-10-CM

## 2014-12-06 DIAGNOSIS — Z7982 Long term (current) use of aspirin: Secondary | ICD-10-CM | POA: Diagnosis not present

## 2014-12-06 DIAGNOSIS — K921 Melena: Secondary | ICD-10-CM | POA: Diagnosis present

## 2014-12-06 DIAGNOSIS — Z8711 Personal history of peptic ulcer disease: Secondary | ICD-10-CM | POA: Diagnosis not present

## 2014-12-06 DIAGNOSIS — R079 Chest pain, unspecified: Secondary | ICD-10-CM | POA: Diagnosis present

## 2014-12-06 DIAGNOSIS — S42401D Unspecified fracture of lower end of right humerus, subsequent encounter for fracture with routine healing: Secondary | ICD-10-CM

## 2014-12-06 DIAGNOSIS — J45909 Unspecified asthma, uncomplicated: Secondary | ICD-10-CM | POA: Diagnosis present

## 2014-12-06 DIAGNOSIS — S42301D Unspecified fracture of shaft of humerus, right arm, subsequent encounter for fracture with routine healing: Secondary | ICD-10-CM

## 2014-12-06 DIAGNOSIS — R12 Heartburn: Secondary | ICD-10-CM

## 2014-12-06 DIAGNOSIS — Z8659 Personal history of other mental and behavioral disorders: Secondary | ICD-10-CM

## 2014-12-06 HISTORY — DX: Bipolar disorder, unspecified: F31.9

## 2014-12-06 HISTORY — DX: Major depressive disorder, single episode, unspecified: F32.9

## 2014-12-06 HISTORY — DX: Depression, unspecified: F32.A

## 2014-12-06 HISTORY — DX: Anxiety disorder, unspecified: F41.9

## 2014-12-06 HISTORY — DX: Migraine, unspecified, not intractable, without status migrainosus: G43.909

## 2014-12-06 HISTORY — DX: Gastrointestinal hemorrhage, unspecified: K92.2

## 2014-12-06 HISTORY — DX: Unspecified asthma, uncomplicated: J45.909

## 2014-12-06 LAB — CBC
HCT: 39.2 % (ref 39.0–52.0)
HCT: 35.2 % — ABNORMAL LOW (ref 39.0–52.0)
HEMOGLOBIN: 12.7 g/dL — AB (ref 13.0–17.0)
HEMOGLOBIN: 11.6 g/dL — AB (ref 13.0–17.0)
MCH: 27.9 pg (ref 26.0–34.0)
MCH: 28.2 pg (ref 26.0–34.0)
MCHC: 32.4 g/dL (ref 30.0–36.0)
MCHC: 33 g/dL (ref 30.0–36.0)
MCV: 86.2 fL (ref 78.0–100.0)
MCV: 85.6 fL (ref 78.0–100.0)
PLATELETS: 416 10*3/uL — AB (ref 150–400)
Platelets: 341 10*3/uL (ref 150–400)
RBC: 4.55 MIL/uL (ref 4.22–5.81)
RBC: 4.11 MIL/uL — ABNORMAL LOW (ref 4.22–5.81)
RDW: 13.5 % (ref 11.5–15.5)
RDW: 13.6 % (ref 11.5–15.5)
WBC: 15.1 10*3/uL — ABNORMAL HIGH (ref 4.0–10.5)
WBC: 13 10*3/uL — ABNORMAL HIGH (ref 4.0–10.5)

## 2014-12-06 LAB — TYPE AND SCREEN
ABO/RH(D): O POS
Antibody Screen: NEGATIVE

## 2014-12-06 LAB — COMPREHENSIVE METABOLIC PANEL
ALBUMIN: 3.7 g/dL (ref 3.5–5.0)
ALK PHOS: 92 U/L (ref 38–126)
ALT: 50 U/L (ref 17–63)
ANION GAP: 10 (ref 5–15)
AST: 26 U/L (ref 15–41)
BUN: 10 mg/dL (ref 6–20)
CALCIUM: 9.5 mg/dL (ref 8.9–10.3)
CO2: 25 mmol/L (ref 22–32)
Chloride: 102 mmol/L (ref 101–111)
Creatinine, Ser: 0.92 mg/dL (ref 0.61–1.24)
GFR calc non Af Amer: >60 mL/min (ref >60)
GLUCOSE: 97 mg/dL (ref 65–99)
POTASSIUM: 3.9 mmol/L (ref 3.5–5.1)
Sodium: 137 mmol/L (ref 135–145)
TOTAL PROTEIN: 7.3 g/dL (ref 6.5–8.1)
Total Bilirubin: 0.4 mg/dL (ref 0.3–1.2)

## 2014-12-06 LAB — ABO/RH: ABO/RH(D): O POS

## 2014-12-06 LAB — POC OCCULT BLOOD, ED: Fecal Occult Bld: POSITIVE — AB

## 2014-12-06 LAB — PROTIME-INR
INR: 1.01 (ref 0.00–1.49)
PROTHROMBIN TIME: 13.5 s (ref 11.6–15.2)

## 2014-12-06 LAB — APTT: aPTT: 33 seconds (ref 24–37)

## 2014-12-06 MED ORDER — SODIUM CHLORIDE 0.9 % IV SOLN
INTRAVENOUS | Status: AC
  Administered 2014-12-06: 19:00:00 via INTRAVENOUS

## 2014-12-06 MED ORDER — SODIUM CHLORIDE 0.9 % IV SOLN: 250.0000 mL | INTRAVENOUS | Status: DC | PRN

## 2014-12-06 MED ORDER — PANTOPRAZOLE SODIUM 40 MG IV SOLR
40.0000 mg | Freq: Two times a day (BID) | INTRAVENOUS | Status: DC
  Administered 2014-12-06 – 2014-12-07 (×2): 40 mg via INTRAVENOUS
  Filled 2014-12-06 (×2): qty 40

## 2014-12-06 MED ORDER — SODIUM CHLORIDE 0.9 % IV BOLUS (SEPSIS)
1000.0000 mL | Freq: Once | INTRAVENOUS | Status: AC
  Administered 2014-12-06: 1000 mL via INTRAVENOUS

## 2014-12-06 MED ORDER — SODIUM CHLORIDE 0.9 % IJ SOLN: 3.0000 mL | INTRAMUSCULAR | Status: DC | PRN

## 2014-12-06 MED ORDER — SODIUM CHLORIDE 0.9 % IJ SOLN
3.0000 mL | Freq: Two times a day (BID) | INTRAMUSCULAR | Status: DC
  Administered 2014-12-07: 3 mL via INTRAVENOUS

## 2014-12-06 MED ORDER — OXYCODONE-ACETAMINOPHEN 5-325 MG PO TABS: 1.0000 | ORAL_TABLET | ORAL | Status: DC | PRN

## 2014-12-06 MED ORDER — PANTOPRAZOLE SODIUM 40 MG IV SOLR: 40.0000 mg | Freq: Two times a day (BID) | INTRAVENOUS | Status: DC

## 2014-12-06 MED ORDER — SODIUM CHLORIDE 0.9 % IJ SOLN
3.0000 mL | Freq: Two times a day (BID) | INTRAMUSCULAR | Status: DC
  Administered 2014-12-06: 3 mL via INTRAVENOUS
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
## 2014-12-06 NOTE — ED Notes (Addendum)
Pt here for rectal bleeding x 3 episodes. sts dark clots. denies any pain. sts light headed and nauseous.
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
## 2014-12-06 NOTE — Progress Notes (Signed)
Pt admitted to room 6E26 from Ed.  Pt alert and oriented. Denies pain at this time. Paged Internal medicine pt in room.
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
## 2014-12-07 ENCOUNTER — Inpatient Hospital Stay (HOSPITAL_COMMUNITY): Payer: Medicaid Other | Admitting: Anesthesiology

## 2014-12-07 ENCOUNTER — Encounter (HOSPITAL_COMMUNITY): Payer: Self-pay | Admitting: Anesthesiology

## 2014-12-07 ENCOUNTER — Encounter (HOSPITAL_COMMUNITY)
Admission: EM | Disposition: A | Discharge status: Home / Self Care | Payer: Self-pay | Source: Home / Self Care | Attending: Oncology

## 2014-12-07 HISTORY — PX: ESOPHAGOGASTRODUODENOSCOPY: SHX5428

## 2014-12-07 LAB — CBC
HCT: 40.2 % (ref 39.0–52.0)
HEMOGLOBIN: 13.1 g/dL (ref 13.0–17.0)
MCH: 28.1 pg (ref 26.0–34.0)
MCHC: 32.6 g/dL (ref 30.0–36.0)
MCV: 86.1 fL (ref 78.0–100.0)
Platelets: 393 10*3/uL (ref 150–400)
RBC: 4.67 MIL/uL (ref 4.22–5.81)
RDW: 13.9 % (ref 11.5–15.5)
WBC: 14.6 10*3/uL — ABNORMAL HIGH (ref 4.0–10.5)

## 2014-12-07 SURGERY — EGD (ESOPHAGOGASTRODUODENOSCOPY)
Anesthesia: Monitor Anesthesia Care

## 2014-12-07 MED ORDER — PROPOFOL INFUSION 10 MG/ML OPTIME
INTRAVENOUS | Status: DC | PRN
  Administered 2014-12-07: 50 ug/kg/min via INTRAVENOUS

## 2014-12-07 MED ORDER — PROPOFOL 10 MG/ML IV BOLUS
INTRAVENOUS | Status: DC | PRN
  Administered 2014-12-07: 20 mg via INTRAVENOUS

## 2014-12-07 MED ORDER — LIDOCAINE HCL (CARDIAC) 20 MG/ML IV SOLN
INTRAVENOUS | Status: DC | PRN
  Administered 2014-12-07: 50 mg via INTRAVENOUS

## 2014-12-07 MED ORDER — BUTAMBEN-TETRACAINE-BENZOCAINE 2-2-14 % EX AERO
INHALATION_SPRAY | CUTANEOUS | Status: DC | PRN
  Administered 2014-12-07: 2 via TOPICAL

## 2014-12-07 MED ORDER — BENZONATATE 100 MG PO CAPS
200.0000 mg | ORAL_CAPSULE | Freq: Once | ORAL | Status: AC
  Administered 2014-12-07: 200 mg via ORAL
  Filled 2014-12-07: qty 2

## 2014-12-07 MED ORDER — LACTATED RINGERS IV SOLN
INTRAVENOUS | Status: DC | PRN
  Administered 2014-12-07: 11:00:00 via INTRAVENOUS

## 2014-12-07 NOTE — Transfer of Care (Signed)
Immediate Anesthesia Transfer of Care Note  Patient: Howard [name]  Procedure(s) Performed: Procedure(s): ESOPHAGOGASTRODUODENOSCOPY (EGD) (N/A)  Patient Location: Endoscopy Unit  Anesthesia Type:MAC  Level of Consciousness: awake, alert , oriented and patient cooperative  Airway & Oxygen Therapy: Patient Spontanous Breathing and Patient connected to nasal cannula oxygen  Post-op Assessment: Report given to RN, Post -op Vital signs reviewed and stable and Patient moving all extremities  Post vital signs: Reviewed and stable  Last Vitals:  Filed Vitals:   12/07/14 1121  BP: 143/78  Pulse: 90  Temp:   Resp: 21    Complications: No apparent anesthesia complications
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
## 2014-12-07 NOTE — Anesthesia Preprocedure Evaluation (Addendum)
Anesthesia Evaluation  Patient identified by MRN, date of birth, ID band Patient awake    Reviewed: Allergy & Precautions, NPO status , Patient's Chart, lab work & pertinent test results  History of Anesthesia Complications Negative for: history of anesthetic complications  Airway Mallampati: III  TM Distance: >3 FB Neck ROM: Full    Dental  (+) Dental Advisory Given, Teeth Intact   Pulmonary neg pulmonary ROS, shortness of breath and with exertion, asthma (as a child) ,  breath sounds clear to auscultation        Cardiovascular hypertension, Pt. on medications Rhythm:Regular Rate:Normal     Neuro/Psych  Headaches, PSYCHIATRIC DISORDERS Bipolar Disorder negative neurological ROS     GI/Hepatic Neg liver ROS, GERD-  Medicated and Controlled,  Endo/Other  Morbid obesity  Renal/GU negative Renal ROS     Musculoskeletal negative musculoskeletal ROS (+)   Abdominal   Peds negative pediatric ROS (+)  Hematology negative hematology ROS (+)   Anesthesia Other Findings   Reproductive/Obstetrics negative OB ROS                            Anesthesia Physical  Anesthesia Plan  ASA: III  Anesthesia Plan: MAC   Post-op Pain Management:    Induction: Intravenous  Airway Management Planned: Natural Airway and Nasal Cannula  Additional Equipment:   Intra-op Plan:   Post-operative Plan: Extubation in OR  Informed Consent: I have reviewed the patients History and Physical, chart, labs and discussed the procedure including the risks, benefits and alternatives for the proposed anesthesia with the patient or authorized representative who has indicated his/her understanding and acceptance.   Dental advisory given  Plan Discussed with: CRNA  Anesthesia Plan Comments:         Anesthesia Quick Evaluation
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
## 2014-12-07 NOTE — Progress Notes (Signed)
Pt discharge instructions given, pt and family verbalized understanding.  VSS. Denies pain. Pt left floor ambulating accompanied by staff and family.

## 2014-12-07 NOTE — Anesthesia Postprocedure Evaluation (Signed)
  Anesthesia Post-op Note  Patient: Howard [name]  Procedure(s) Performed: Procedure(s): ESOPHAGOGASTRODUODENOSCOPY (EGD) (N/A)  Patient Location: PACU  Anesthesia Type:MAC  Level of Consciousness: awake, alert  and oriented  Airway and Oxygen Therapy: Patient Spontanous Breathing  Post-op Pain: none  Post-op Assessment: Post-op Vital signs reviewed              Post-op Vital Signs: Reviewed  Last Vitals:  Filed Vitals:   12/07/14 1121  BP: 143/78  Pulse: 90  Temp:   Resp: 21    Complications: No apparent anesthesia complications

## 2014-12-07 NOTE — Op Note (Signed)
[facility] [address] [place], [address]   ENDOSCOPY PROCEDURE REPORT  PATIENT: Howard [name], Howard [name]  MR#: [id number] BIRTHDATE: 1990/09/23 , 23  yrs. old GENDER: male ENDOSCOPIST: Wandalee Ferdinand, MD REFERRED BY: PROCEDURE DATE:  2014/12/21 PROCEDURE:   EGD ASA CLASS:      3 INDICATIONS: MEDICATIONS:  propofol per anesthesia TOPICAL ANESTHETIC:  DESCRIPTION OF PROCEDURE: After the risks benefits and alternatives of the procedure were thoroughly explained, informed consent was obtained.  The Pentax Gastroscope [id number] endoscope was introduced through the mouth and advanced to the second portion of the duodenum , Without limitations.  The instrument was slowly withdrawn as the mucosa was fully examined. Estimated blood loss is zero unless otherwise noted in this procedure report.  Findings  esophagus: Normal  stomach: small hiatal hernia otherwise normal  duodenum: Normal    The scope was then withdrawn from the patient and the procedure completed.  COMPLICATIONS: There were no immediate complications.  ENDOSCOPIC IMPRESSION: small hiatal hernia otherwise normal EGD   RECOMMENDATIONS: because of the gastrointestinal bleeding, I recommended a colonoscopy. the patient however wishes to go home and consider having this as an outpatient. from a GI standpoint he appears to be clinically stable. I would advance his diet, send him home, and he can follow-up as an outpatient for colonoscopy.   REPEAT EXAM:  eSignedWandalee Ferdinand, MD Dec 21, 2014 11:36 AM    CC:  CPT CODES: ICD CODES:  The ICD and CPT codes recommended by this software are interpretations from the data that the clinical staff has captured with the software.  The verification of the translation of this report to the ICD and CPT codes and modifiers is the sole responsibility of the health care institution and practicing physician where this report was generated.  [employer] will not be held responsible for the validity of the ICD and CPT codes included on this report.  AMA assumes no liability for data contained or not contained herein. CPT is a Publishing rights manager of the [employer].  PATIENT NAME:  Howard [name], Howard [name] MR#: [id number]
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
## 2014-12-07 NOTE — Progress Notes (Signed)
Pt gone down via bed for EGD. 

## 2014-12-08 ENCOUNTER — Encounter (HOSPITAL_COMMUNITY): Payer: Self-pay | Admitting: Gastroenterology

## 2014-12-18 ENCOUNTER — Other Ambulatory Visit: Payer: Self-pay | Admitting: Orthopedic Surgery

## 2014-12-18 DIAGNOSIS — M75102 Unspecified rotator cuff tear or rupture of left shoulder, not specified as traumatic: Secondary | ICD-10-CM

## 2014-12-29 ENCOUNTER — Ambulatory Visit
Admission: RE | Admit: 2014-12-29 | Discharge: 2014-12-29 | Disposition: A | Discharge status: Home / Self Care | Payer: Medicaid Other | Source: Ambulatory Visit | Attending: Orthopedic Surgery | Admitting: Orthopedic Surgery

## 2014-12-29 DIAGNOSIS — M75102 Unspecified rotator cuff tear or rupture of left shoulder, not specified as traumatic: Secondary | ICD-10-CM

## 2015-01-18 ENCOUNTER — Ambulatory Visit: Payer: Medicaid Other | Attending: Orthopedic Surgery | Admitting: Physical Therapy

## 2015-01-18 DIAGNOSIS — M25512 Pain in left shoulder: Secondary | ICD-10-CM

## 2015-01-18 DIAGNOSIS — M25511 Pain in right shoulder: Secondary | ICD-10-CM | POA: Insufficient documentation

## 2015-01-18 DIAGNOSIS — R531 Weakness: Secondary | ICD-10-CM | POA: Diagnosis present

## 2015-01-18 NOTE — Therapy (Signed)
[facility] [address] [place], [place], [address] Phone: [phone]   Fax:  [phone]  Physical Therapy Evaluation  Patient Details  Name: Howard [name] MRN: [id number] Date of Birth: 1990-05-03 Referring Provider:  Jones Broom, MD  Encounter Date: 01/18/2015      PT End of Session - 01/18/15 1435    Visit Number 1   Number of Visits 12   Date for PT Re-Evaluation 03/08/15   PT Start Time 0151   PT Stop Time 0225   PT Time Calculation (min) 34 min   Activity Tolerance Patient tolerated treatment well   Behavior During Therapy [facility] for tasks assessed/performed      Past Medical History  Diagnosis Date  . Obese   . Hypertension   . Shortness of breath dyspnea     with exertion  . Pollen allergies   . GERD (gastroesophageal reflux disease)   . Chronic chest pain   . Bipolar 1 disorder   . Anxiety   . Depression   . Childhood asthma   . Headache     "a few times/month" (12/06/2014)  . Migraine     "a few times/month" (12/06/2014)  . Acute lower GI bleeding hospitalized 12/06/2014    Past Surgical History  Procedure Laterality Date  . Orif humerus fracture Right 11/30/2014    Procedure: OPEN REDUCTION INTERNAL FIXATION (ORIF) RIGHT Distal HUMERUS FRACTURE;  Surgeon: Jones Broom, MD;  Location: [facility] OR;  Service: Orthopedics;  Laterality: Right;  . Fracture surgery    . Esophagogastroduodenoscopy N/A 12/07/2014    Procedure: ESOPHAGOGASTRODUODENOSCOPY (EGD);  Surgeon: Graylin Shiver, MD;  Location: [facility] ENDOSCOPY;  Service: Endoscopy;  Laterality: N/A;    There were no vitals filed for this visit.  Visit Diagnosis:  Right shoulder pain - Plan: PT plan of care cert/re-cert  Left shoulder pain - Plan: PT plan of care cert/re-cert  Weakness - Plan: PT plan of care cert/re-cert      Subjective Assessment - 01/18/15 1436    Currently in Pain? Yes   Pain Score 2    Pain Location Shoulder   Pain Orientation  Right;Left   Pain Descriptors / Indicators Aching   Pain Type --  Sub-acute.   Pain Onset More than a month ago   Pain Frequency Constant   Aggravating Factors  Movement.   Pain Relieving Factors Rest.            [facility] PT Assessment - 01/18/15 0001    Assessment   Medical Diagnosis S/p right shoulder ORIF.  Left shoulder dislocation.   Onset Date/Surgical Date --  11/28/14.   Precautions   Precautions None   Restrictions   Weight Bearing Restrictions No   Balance Screen   Has the patient fallen in the past 6 months No   Has the patient had a decrease in activity level because of a fear of falling?  No   Is the patient reluctant to leave their home because of a fear of falling?  No   Prior Function   Level of Independence Independent   Cognition   Overall Cognitive Status Within Functional Limits for tasks assessed   ROM / Strength   AROM / PROM / Strength AROM;Strength   AROM   Overall AROM Comments Left shoulder ER= 70 degrees.  Active left shoulder abduction produced hiking and superior migration her his left humeral head.  Normal right UE movement at shoulder; elbow; forearm and wrist/hand.   Strength  Overall Strength Comments Left shoulder IR/ER= 4/5 and abduction= 3-/5.   Palpation   Palpation comment No significant areas of palpable tenderness.                                PT Long Term Goals - 01/18/15 1442    PT LONG TERM GOAL #1   Title Ind with HEP.   Baseline No knowledge of appropriate ther ex.   Time 6   Period Weeks   Status New   PT LONG TERM GOAL #2   Title Left shoulder ER= 90 degrees.   Baseline 70 degrees.   Time 6   Period Weeks   Status New   PT LONG TERM GOAL #3   Title Perform ADL's with left shoulder pain not > 2/10.   Baseline Pain can rise to    PT LONG TERM GOAL #4   Title Left shoulder strength= 5/5 to increase stability for functional tasks.   Baseline Left shoulder strength grades 3-/5 and 4/5.   Time  6   Period Weeks               Plan - 01/18/15 1447    PT Treatment/Interventions Therapeutic activities;Therapeutic exercise;Patient/family education;Iontophoresis /ml Dexamethasone         Problem List Patient Active Problem List   Diagnosis Date Noted  . GI bleed 12/06/2014  . Hematochezia 12/06/2014  . Heartburn 12/06/2014  . Bleeding gastrointestinal   . Bipolar I disorder, most recent episode (or current) unspecified   . Essential hypertension   . Morbid obesity   . Displaced spiral fracture of shaft of right humerus 11/30/2014    Serrena Linderman, [place] MPT 01/18/2015, 2:49 PM  [facility] [address] [place], [place], [address] Phone: [phone]   Fax:  [phone]

## 2015-02-07 ENCOUNTER — Ambulatory Visit: Payer: Medicaid Other | Admitting: Physical Therapy

## 2015-02-09 ENCOUNTER — Encounter: Payer: Medicaid Other | Admitting: *Deleted

## 2015-02-13 ENCOUNTER — Encounter: Payer: Medicaid Other | Admitting: Physical Therapy
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
## 2015-10-23 ENCOUNTER — Other Ambulatory Visit: Payer: Self-pay | Admitting: Gastroenterology

## 2015-10-23 DIAGNOSIS — R1013 Epigastric pain: Secondary | ICD-10-CM

## 2015-10-29 ENCOUNTER — Ambulatory Visit
Admission: RE | Admit: 2015-10-29 | Discharge: 2015-10-29 | Disposition: A | Discharge status: Home / Self Care | Payer: Medicaid Other | Source: Ambulatory Visit | Attending: Gastroenterology | Admitting: Gastroenterology

## 2015-10-29 DIAGNOSIS — R1013 Epigastric pain: Secondary | ICD-10-CM

## 2016-06-03 DIAGNOSIS — J45909 Unspecified asthma, uncomplicated: Secondary | ICD-10-CM | POA: Diagnosis not present

## 2016-06-03 DIAGNOSIS — G4719 Other hypersomnia: Secondary | ICD-10-CM | POA: Diagnosis not present

## 2016-06-03 DIAGNOSIS — G4733 Obstructive sleep apnea (adult) (pediatric): Secondary | ICD-10-CM | POA: Diagnosis not present

## 2016-07-01 DIAGNOSIS — G4733 Obstructive sleep apnea (adult) (pediatric): Secondary | ICD-10-CM | POA: Diagnosis not present

## 2016-07-27 DIAGNOSIS — G4733 Obstructive sleep apnea (adult) (pediatric): Secondary | ICD-10-CM | POA: Diagnosis not present

## 2016-08-26 DIAGNOSIS — G4733 Obstructive sleep apnea (adult) (pediatric): Secondary | ICD-10-CM | POA: Diagnosis not present

## 2016-09-26 DIAGNOSIS — G4733 Obstructive sleep apnea (adult) (pediatric): Secondary | ICD-10-CM | POA: Diagnosis not present

## 2016-10-26 DIAGNOSIS — G4733 Obstructive sleep apnea (adult) (pediatric): Secondary | ICD-10-CM | POA: Diagnosis not present
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
## 2017-04-28 DIAGNOSIS — R0600 Dyspnea, unspecified: Secondary | ICD-10-CM | POA: Diagnosis not present

## 2017-11-25 DIAGNOSIS — M109 Gout, unspecified: Secondary | ICD-10-CM | POA: Diagnosis not present

## 2017-11-25 DIAGNOSIS — I10 Essential (primary) hypertension: Secondary | ICD-10-CM | POA: Diagnosis not present

## 2017-11-25 DIAGNOSIS — K219 Gastro-esophageal reflux disease without esophagitis: Secondary | ICD-10-CM | POA: Diagnosis not present

## 2017-11-25 DIAGNOSIS — Z0000 Encounter for general adult medical examination without abnormal findings: Secondary | ICD-10-CM | POA: Diagnosis not present

## 2017-11-25 DIAGNOSIS — E785 Hyperlipidemia, unspecified: Secondary | ICD-10-CM | POA: Diagnosis not present

## 2017-11-25 DIAGNOSIS — Z6841 Body Mass Index (BMI) 40.0 and over, adult: Secondary | ICD-10-CM | POA: Diagnosis not present

## 2017-11-25 DIAGNOSIS — F319 Bipolar disorder, unspecified: Secondary | ICD-10-CM | POA: Diagnosis not present

## 2017-11-27 DIAGNOSIS — M109 Gout, unspecified: Secondary | ICD-10-CM | POA: Diagnosis not present

## 2017-11-27 DIAGNOSIS — Z6841 Body Mass Index (BMI) 40.0 and over, adult: Secondary | ICD-10-CM | POA: Diagnosis not present

## 2017-11-27 DIAGNOSIS — I10 Essential (primary) hypertension: Secondary | ICD-10-CM | POA: Diagnosis not present

## 2017-11-27 DIAGNOSIS — Z1211 Encounter for screening for malignant neoplasm of colon: Secondary | ICD-10-CM | POA: Diagnosis not present

## 2017-12-18 DIAGNOSIS — F331 Major depressive disorder, recurrent, moderate: Secondary | ICD-10-CM | POA: Diagnosis not present

## 2018-01-26 DIAGNOSIS — S93601A Unspecified sprain of right foot, initial encounter: Secondary | ICD-10-CM | POA: Diagnosis not present

## 2018-01-26 DIAGNOSIS — S99921A Unspecified injury of right foot, initial encounter: Secondary | ICD-10-CM | POA: Diagnosis not present

## 2018-03-10 DIAGNOSIS — F411 Generalized anxiety disorder: Secondary | ICD-10-CM | POA: Diagnosis not present

## 2018-03-10 DIAGNOSIS — F319 Bipolar disorder, unspecified: Secondary | ICD-10-CM | POA: Diagnosis not present

## 2018-03-10 DIAGNOSIS — F4310 Post-traumatic stress disorder, unspecified: Secondary | ICD-10-CM | POA: Diagnosis not present

## 2018-06-25 DIAGNOSIS — F411 Generalized anxiety disorder: Secondary | ICD-10-CM | POA: Diagnosis not present

## 2018-06-25 DIAGNOSIS — F319 Bipolar disorder, unspecified: Secondary | ICD-10-CM | POA: Diagnosis not present

## 2018-06-25 DIAGNOSIS — F4310 Post-traumatic stress disorder, unspecified: Secondary | ICD-10-CM | POA: Diagnosis not present

## 2018-06-25 DIAGNOSIS — Z6841 Body Mass Index (BMI) 40.0 and over, adult: Secondary | ICD-10-CM | POA: Diagnosis not present

## 2018-08-03 DIAGNOSIS — M109 Gout, unspecified: Secondary | ICD-10-CM | POA: Diagnosis not present

## 2018-08-13 DIAGNOSIS — J302 Other seasonal allergic rhinitis: Secondary | ICD-10-CM | POA: Diagnosis not present

## 2018-08-31 DIAGNOSIS — F411 Generalized anxiety disorder: Secondary | ICD-10-CM | POA: Diagnosis not present

## 2018-08-31 DIAGNOSIS — F319 Bipolar disorder, unspecified: Secondary | ICD-10-CM | POA: Diagnosis not present

## 2018-08-31 DIAGNOSIS — F4310 Post-traumatic stress disorder, unspecified: Secondary | ICD-10-CM | POA: Diagnosis not present

## 2018-08-31 DIAGNOSIS — Z6841 Body Mass Index (BMI) 40.0 and over, adult: Secondary | ICD-10-CM | POA: Diagnosis not present

## 2019-01-24 DIAGNOSIS — Z23 Encounter for immunization: Secondary | ICD-10-CM | POA: Diagnosis not present

## 2019-01-24 DIAGNOSIS — E785 Hyperlipidemia, unspecified: Secondary | ICD-10-CM | POA: Diagnosis not present

## 2019-01-24 DIAGNOSIS — I10 Essential (primary) hypertension: Secondary | ICD-10-CM | POA: Diagnosis not present

## 2019-01-24 DIAGNOSIS — M1000 Idiopathic gout, unspecified site: Secondary | ICD-10-CM | POA: Diagnosis not present

## 2019-01-24 DIAGNOSIS — Z0000 Encounter for general adult medical examination without abnormal findings: Secondary | ICD-10-CM | POA: Diagnosis not present

## 2019-01-24 DIAGNOSIS — R112 Nausea with vomiting, unspecified: Secondary | ICD-10-CM | POA: Diagnosis not present

## 2019-01-24 DIAGNOSIS — F319 Bipolar disorder, unspecified: Secondary | ICD-10-CM | POA: Diagnosis not present

## 2019-01-24 DIAGNOSIS — K219 Gastro-esophageal reflux disease without esophagitis: Secondary | ICD-10-CM | POA: Diagnosis not present

## 2019-01-24 DIAGNOSIS — M545 Low back pain: Secondary | ICD-10-CM | POA: Diagnosis not present

## 2019-01-24 DIAGNOSIS — B079 Viral wart, unspecified: Secondary | ICD-10-CM | POA: Diagnosis not present

## 2019-01-24 DIAGNOSIS — J4520 Mild intermittent asthma, uncomplicated: Secondary | ICD-10-CM | POA: Diagnosis not present

## 2019-03-30 DIAGNOSIS — Z6841 Body Mass Index (BMI) 40.0 and over, adult: Secondary | ICD-10-CM | POA: Diagnosis not present

## 2019-03-30 DIAGNOSIS — F319 Bipolar disorder, unspecified: Secondary | ICD-10-CM | POA: Diagnosis not present

## 2019-03-30 DIAGNOSIS — F4310 Post-traumatic stress disorder, unspecified: Secondary | ICD-10-CM | POA: Diagnosis not present

## 2019-03-30 DIAGNOSIS — F411 Generalized anxiety disorder: Secondary | ICD-10-CM | POA: Diagnosis not present

## 2019-06-08 DIAGNOSIS — Z6841 Body Mass Index (BMI) 40.0 and over, adult: Secondary | ICD-10-CM | POA: Diagnosis not present

## 2019-06-08 DIAGNOSIS — F4310 Post-traumatic stress disorder, unspecified: Secondary | ICD-10-CM | POA: Diagnosis not present

## 2019-06-08 DIAGNOSIS — F411 Generalized anxiety disorder: Secondary | ICD-10-CM | POA: Diagnosis not present

## 2019-06-08 DIAGNOSIS — F319 Bipolar disorder, unspecified: Secondary | ICD-10-CM | POA: Diagnosis not present

## 2019-06-23 DIAGNOSIS — J4520 Mild intermittent asthma, uncomplicated: Secondary | ICD-10-CM | POA: Diagnosis not present

## 2019-06-23 DIAGNOSIS — Z6841 Body Mass Index (BMI) 40.0 and over, adult: Secondary | ICD-10-CM | POA: Diagnosis not present

## 2019-06-23 DIAGNOSIS — J302 Other seasonal allergic rhinitis: Secondary | ICD-10-CM | POA: Diagnosis not present

## 2019-06-23 DIAGNOSIS — K219 Gastro-esophageal reflux disease without esophagitis: Secondary | ICD-10-CM | POA: Diagnosis not present

## 2019-06-23 DIAGNOSIS — F319 Bipolar disorder, unspecified: Secondary | ICD-10-CM | POA: Diagnosis not present

## 2019-06-23 DIAGNOSIS — I10 Essential (primary) hypertension: Secondary | ICD-10-CM | POA: Diagnosis not present

## 2019-06-23 DIAGNOSIS — B079 Viral wart, unspecified: Secondary | ICD-10-CM | POA: Diagnosis not present

## 2019-08-10 DIAGNOSIS — Z23 Encounter for immunization: Secondary | ICD-10-CM | POA: Diagnosis not present

## 2019-09-07 DIAGNOSIS — Z23 Encounter for immunization: Secondary | ICD-10-CM | POA: Diagnosis not present

## 2019-11-23 DIAGNOSIS — F319 Bipolar disorder, unspecified: Secondary | ICD-10-CM | POA: Diagnosis not present

## 2019-11-23 DIAGNOSIS — F411 Generalized anxiety disorder: Secondary | ICD-10-CM | POA: Diagnosis not present

## 2019-11-23 DIAGNOSIS — Z6841 Body Mass Index (BMI) 40.0 and over, adult: Secondary | ICD-10-CM | POA: Diagnosis not present

## 2019-11-23 DIAGNOSIS — F4310 Post-traumatic stress disorder, unspecified: Secondary | ICD-10-CM | POA: Diagnosis not present

## 2020-03-27 DIAGNOSIS — F411 Generalized anxiety disorder: Secondary | ICD-10-CM | POA: Diagnosis not present

## 2020-03-27 DIAGNOSIS — F319 Bipolar disorder, unspecified: Secondary | ICD-10-CM | POA: Diagnosis not present

## 2020-03-27 DIAGNOSIS — F5105 Insomnia due to other mental disorder: Secondary | ICD-10-CM | POA: Diagnosis not present

## 2020-03-27 DIAGNOSIS — F4310 Post-traumatic stress disorder, unspecified: Secondary | ICD-10-CM | POA: Diagnosis not present

## 2020-03-27 DIAGNOSIS — Z6841 Body Mass Index (BMI) 40.0 and over, adult: Secondary | ICD-10-CM | POA: Diagnosis not present

## 2020-03-30 DIAGNOSIS — R197 Diarrhea, unspecified: Secondary | ICD-10-CM | POA: Diagnosis not present

## 2020-03-30 DIAGNOSIS — R059 Cough, unspecified: Secondary | ICD-10-CM | POA: Diagnosis not present

## 2020-03-30 DIAGNOSIS — R52 Pain, unspecified: Secondary | ICD-10-CM | POA: Diagnosis not present

## 2020-03-30 DIAGNOSIS — J40 Bronchitis, not specified as acute or chronic: Secondary | ICD-10-CM | POA: Diagnosis not present

## 2020-03-30 DIAGNOSIS — R519 Headache, unspecified: Secondary | ICD-10-CM | POA: Diagnosis not present

## 2020-04-10 DIAGNOSIS — Z23 Encounter for immunization: Secondary | ICD-10-CM | POA: Diagnosis not present

## 2020-09-12 DIAGNOSIS — F5105 Insomnia due to other mental disorder: Secondary | ICD-10-CM | POA: Diagnosis not present

## 2020-09-12 DIAGNOSIS — F411 Generalized anxiety disorder: Secondary | ICD-10-CM | POA: Diagnosis not present

## 2020-09-12 DIAGNOSIS — F4310 Post-traumatic stress disorder, unspecified: Secondary | ICD-10-CM | POA: Diagnosis not present

## 2020-09-12 DIAGNOSIS — F319 Bipolar disorder, unspecified: Secondary | ICD-10-CM | POA: Diagnosis not present

## 2021-02-19 ENCOUNTER — Ambulatory Visit: Payer: Self-pay | Admitting: Family Medicine

## 2021-02-26 DIAGNOSIS — R42 Dizziness and giddiness: Secondary | ICD-10-CM | POA: Diagnosis not present

## 2021-02-26 DIAGNOSIS — R000 Tachycardia, unspecified: Secondary | ICD-10-CM | POA: Diagnosis not present

## 2021-02-26 DIAGNOSIS — R519 Headache, unspecified: Secondary | ICD-10-CM | POA: Diagnosis not present

## 2021-02-26 DIAGNOSIS — I2699 Other pulmonary embolism without acute cor pulmonale: Secondary | ICD-10-CM | POA: Diagnosis not present

## 2021-02-26 DIAGNOSIS — R0689 Other abnormalities of breathing: Secondary | ICD-10-CM | POA: Diagnosis not present

## 2021-02-26 DIAGNOSIS — D72829 Elevated white blood cell count, unspecified: Secondary | ICD-10-CM | POA: Diagnosis not present

## 2021-02-26 DIAGNOSIS — I10 Essential (primary) hypertension: Secondary | ICD-10-CM | POA: Diagnosis not present

## 2021-02-26 DIAGNOSIS — R079 Chest pain, unspecified: Secondary | ICD-10-CM | POA: Diagnosis not present

## 2021-02-26 DIAGNOSIS — J42 Unspecified chronic bronchitis: Secondary | ICD-10-CM | POA: Diagnosis not present

## 2021-02-26 DIAGNOSIS — I4510 Unspecified right bundle-branch block: Secondary | ICD-10-CM | POA: Diagnosis not present

## 2021-02-26 DIAGNOSIS — I959 Hypotension, unspecified: Secondary | ICD-10-CM | POA: Diagnosis not present

## 2021-02-26 DIAGNOSIS — R0789 Other chest pain: Secondary | ICD-10-CM | POA: Diagnosis not present

## 2021-03-12 DIAGNOSIS — F319 Bipolar disorder, unspecified: Secondary | ICD-10-CM | POA: Diagnosis not present

## 2021-03-12 DIAGNOSIS — F5105 Insomnia due to other mental disorder: Secondary | ICD-10-CM | POA: Diagnosis not present

## 2021-03-12 DIAGNOSIS — F411 Generalized anxiety disorder: Secondary | ICD-10-CM | POA: Diagnosis not present

## 2021-03-12 DIAGNOSIS — Z6841 Body Mass Index (BMI) 40.0 and over, adult: Secondary | ICD-10-CM | POA: Diagnosis not present

## 2021-03-12 DIAGNOSIS — F4310 Post-traumatic stress disorder, unspecified: Secondary | ICD-10-CM | POA: Diagnosis not present

## 2021-03-20 DIAGNOSIS — J4541 Moderate persistent asthma with (acute) exacerbation: Secondary | ICD-10-CM | POA: Diagnosis not present

## 2021-03-20 DIAGNOSIS — I10 Essential (primary) hypertension: Secondary | ICD-10-CM | POA: Diagnosis not present

## 2021-05-14 DIAGNOSIS — F319 Bipolar disorder, unspecified: Secondary | ICD-10-CM | POA: Diagnosis not present

## 2021-05-14 DIAGNOSIS — J4520 Mild intermittent asthma, uncomplicated: Secondary | ICD-10-CM | POA: Diagnosis not present

## 2021-05-14 DIAGNOSIS — I10 Essential (primary) hypertension: Secondary | ICD-10-CM | POA: Diagnosis not present

## 2021-05-14 DIAGNOSIS — U071 COVID-19: Secondary | ICD-10-CM | POA: Diagnosis not present

## 2021-05-16 DIAGNOSIS — U071 COVID-19: Secondary | ICD-10-CM | POA: Diagnosis not present

## 2021-05-21 DIAGNOSIS — F411 Generalized anxiety disorder: Secondary | ICD-10-CM | POA: Diagnosis not present

## 2021-05-21 DIAGNOSIS — F4310 Post-traumatic stress disorder, unspecified: Secondary | ICD-10-CM | POA: Diagnosis not present

## 2021-05-21 DIAGNOSIS — F5105 Insomnia due to other mental disorder: Secondary | ICD-10-CM | POA: Diagnosis not present

## 2021-05-21 DIAGNOSIS — Z6841 Body Mass Index (BMI) 40.0 and over, adult: Secondary | ICD-10-CM | POA: Diagnosis not present

## 2021-05-21 DIAGNOSIS — F319 Bipolar disorder, unspecified: Secondary | ICD-10-CM | POA: Diagnosis not present

## 2021-10-03 DIAGNOSIS — Z23 Encounter for immunization: Secondary | ICD-10-CM | POA: Diagnosis not present

## 2021-10-03 DIAGNOSIS — L03011 Cellulitis of right finger: Secondary | ICD-10-CM | POA: Diagnosis not present

## 2022-03-13 DIAGNOSIS — M109 Gout, unspecified: Secondary | ICD-10-CM | POA: Diagnosis not present

## 2022-03-13 DIAGNOSIS — J4541 Moderate persistent asthma with (acute) exacerbation: Secondary | ICD-10-CM | POA: Diagnosis not present

## 2022-03-18 DIAGNOSIS — F5105 Insomnia due to other mental disorder: Secondary | ICD-10-CM | POA: Diagnosis not present

## 2022-03-18 DIAGNOSIS — F411 Generalized anxiety disorder: Secondary | ICD-10-CM | POA: Diagnosis not present

## 2022-03-18 DIAGNOSIS — F319 Bipolar disorder, unspecified: Secondary | ICD-10-CM | POA: Diagnosis not present

## 2022-03-18 DIAGNOSIS — Z6841 Body Mass Index (BMI) 40.0 and over, adult: Secondary | ICD-10-CM | POA: Diagnosis not present

## 2022-05-03 DIAGNOSIS — R309 Painful micturition, unspecified: Secondary | ICD-10-CM | POA: Diagnosis not present

## 2022-05-03 DIAGNOSIS — B356 Tinea cruris: Secondary | ICD-10-CM | POA: Diagnosis not present

## 2022-05-08 DIAGNOSIS — F411 Generalized anxiety disorder: Secondary | ICD-10-CM | POA: Diagnosis not present

## 2022-05-08 DIAGNOSIS — F319 Bipolar disorder, unspecified: Secondary | ICD-10-CM | POA: Diagnosis not present

## 2022-05-08 DIAGNOSIS — R079 Chest pain, unspecified: Secondary | ICD-10-CM | POA: Diagnosis not present

## 2022-05-08 DIAGNOSIS — M109 Gout, unspecified: Secondary | ICD-10-CM | POA: Diagnosis not present

## 2022-05-08 DIAGNOSIS — J4520 Mild intermittent asthma, uncomplicated: Secondary | ICD-10-CM | POA: Diagnosis not present

## 2022-05-08 DIAGNOSIS — F5105 Insomnia due to other mental disorder: Secondary | ICD-10-CM | POA: Diagnosis not present

## 2022-05-08 DIAGNOSIS — K219 Gastro-esophageal reflux disease without esophagitis: Secondary | ICD-10-CM | POA: Diagnosis not present

## 2022-05-08 DIAGNOSIS — F4310 Post-traumatic stress disorder, unspecified: Secondary | ICD-10-CM | POA: Diagnosis not present

## 2022-05-08 DIAGNOSIS — I10 Essential (primary) hypertension: Secondary | ICD-10-CM | POA: Diagnosis not present

## 2022-05-08 DIAGNOSIS — B001 Herpesviral vesicular dermatitis: Secondary | ICD-10-CM | POA: Diagnosis not present

## 2022-05-20 DIAGNOSIS — W57XXXA Bitten or stung by nonvenomous insect and other nonvenomous arthropods, initial encounter: Secondary | ICD-10-CM | POA: Diagnosis not present

## 2022-05-20 DIAGNOSIS — R233 Spontaneous ecchymoses: Secondary | ICD-10-CM | POA: Diagnosis not present

## 2022-06-11 NOTE — Progress Notes (Deleted)
New Patient Note  RE: Howard [name] MRN: [id number] DOB: 1990/09/05 Date of Office Visit: 06/12/2022  Consult requested by: Garnet Sierras, NP Primary care provider: Dione Housekeeper, MD  Chief Complaint: No chief complaint on file.  History of Present Illness: I had the pleasure of seeing Howard [name] for initial evaluation at the [facility] on 06/11/2022. He is a 32 y.o. male, who is referred here by Dione Housekeeper, MD for the evaluation of ***.  ***  Assessment and Plan: Howard [name] is a 32 y.o. male with: No problem-specific Assessment & Plan notes found for this encounter.  No follow-ups on file.  No orders of the defined types were placed in this encounter.  Lab Orders  No laboratory test(s) ordered today    Other allergy screening: Asthma: {Blank single:19197::"yes","no"} Rhino conjunctivitis: {Blank single:19197::"yes","no"} Food allergy: {Blank single:19197::"yes","no"} Medication allergy: {Blank single:19197::"yes","no"} Hymenoptera allergy: {Blank single:19197::"yes","no"} Urticaria: {Blank single:19197::"yes","no"} Eczema:{Blank single:19197::"yes","no"} History of recurrent infections suggestive of immunodeficency: {Blank single:19197::"yes","no"}  Diagnostics: Spirometry:  Tracings reviewed. His effort: {Blank single:19197::"Good reproducible efforts.","It was hard to get consistent efforts and there is a question as to whether this reflects a maximal maneuver.","Poor effort, data can not be interpreted."} FVC: ***L FEV1: ***L, ***% predicted FEV1/FVC ratio: ***% Interpretation: {Blank single:19197::"Spirometry consistent with mild obstructive disease","Spirometry consistent with moderate obstructive disease","Spirometry consistent with severe obstructive disease","Spirometry consistent with possible restrictive disease","Spirometry consistent with mixed obstructive and restrictive disease","Spirometry uninterpretable due to  technique","Spirometry consistent with normal pattern","No overt abnormalities noted given today's efforts"}.  Please see scanned spirometry results for details.  Skin Testing: {Blank single:19197::"Select foods","Environmental allergy panel","Environmental allergy panel and select foods","Food allergy panel","None","Deferred due to recent antihistamines use"}. *** Results discussed with patient/family.   Past Medical History: Patient Active Problem List   Diagnosis Date Noted  . GI bleed 12/06/2014  . Hematochezia 12/06/2014  . Heartburn 12/06/2014  . Bleeding gastrointestinal   . Bipolar I disorder, most recent episode (or current) unspecified   . Essential hypertension   . Morbid obesity ([place])   . Displaced spiral fracture of shaft of right humerus 11/30/2014   Past Medical History:  Diagnosis Date  . Acute lower GI bleeding hospitalized 12/06/2014  . Anxiety   . Bipolar 1 disorder ([place])   . Childhood asthma   . Chronic chest pain   . Depression   . GERD (gastroesophageal reflux disease)   . Headache    "a few times/month" (12/06/2014)  . Hypertension   . Migraine    "a few times/month" (12/06/2014)  . Obese   . Pollen allergies   . Shortness of breath dyspnea    with exertion   Past Surgical History: Past Surgical History:  Procedure Laterality Date  . ESOPHAGOGASTRODUODENOSCOPY N/A 12/07/2014   Procedure: ESOPHAGOGASTRODUODENOSCOPY (EGD);  Surgeon: Wonda Horner, MD;  Location: [facility] ENDOSCOPY;  Service: Endoscopy;  Laterality: N/A;  . FRACTURE SURGERY    . ORIF HUMERUS FRACTURE Right 11/30/2014   Procedure: OPEN REDUCTION INTERNAL FIXATION (ORIF) RIGHT Distal HUMERUS FRACTURE;  Surgeon: Tania Ade, MD;  Location: [facility];  Service: Orthopedics;  Laterality: Right;   Medication List:  Current Outpatient Medications  Medication Sig Dispense Refill  . amLODipine-benazepril (LOTREL) 5-10 MG per capsule Take 1 capsule by mouth daily.    Marland Kitchen docusate sodium (COLACE) 100 MG  capsule Take 1 capsule (100 mg total) by mouth 3 (three) times daily as needed. (Patient not taking: Reported on 01/18/2015) 20 capsule 0  . hydrochlorothiazide (HYDRODIURIL) 25 MG tablet  Take 25 mg by mouth daily.    Marland Kitchen omeprazole (PRILOSEC) 20 MG capsule Take 40 mg by mouth daily.     Marland Kitchen oxyCODONE-acetaminophen (ROXICET) 5-325 MG per tablet Take 1-2 tablets by mouth every 4 (four) hours as needed for severe pain. (Patient not taking: Reported on 01/18/2015) 60 tablet 0   No current facility-administered medications for this visit.   Allergies: No Known Allergies Social History: Social History   Socioeconomic History  . Marital status: Single    Spouse name: Not on file  . Number of children: Not on file  . Years of education: Not on file  . Highest education level: Not on file  Occupational History  . Not on file  Tobacco Use  . Smoking status: Never  . Smokeless tobacco: Never  Substance and Sexual Activity  . Alcohol use: No  . Drug use: No  . Sexual activity: Never  Other Topics Concern  . Not on file  Social History Narrative  . Not on file   Social Determinants of Health   Financial Resource Strain: Not on file  Food Insecurity: Not on file  Transportation Needs: Not on file  Physical Activity: Not on file  Stress: Not on file  Social Connections: Not on file   Lives in a ***. Smoking: *** Occupation: ***  Environmental HistoryFreight forwarder in the house: Estate agent in the family room: {Blank single:19197::"yes","no"} Carpet in the bedroom: {Blank single:19197::"yes","no"} Heating: {Blank single:19197::"electric","gas","heat pump"} Cooling: {Blank single:19197::"central","window","heat pump"} Pet: {Blank single:19197::"yes ***","no"}  Family History: Family History  Problem Relation Age of Onset  . Hypertension Mother   . Cancer Other    Problem                               Relation Asthma                                    *** Eczema                                *** Food allergy                          *** Allergic rhino conjunctivitis     ***  Review of Systems  Constitutional:  Negative for appetite change, chills, fever and unexpected weight change.  HENT:  Negative for congestion and rhinorrhea.   Eyes:  Negative for itching.  Respiratory:  Negative for cough, chest tightness, shortness of breath and wheezing.   Cardiovascular:  Negative for chest pain.  Gastrointestinal:  Negative for abdominal pain.  Genitourinary:  Negative for difficulty urinating.  Skin:  Negative for rash.  Neurological:  Negative for headaches.   Objective: There were no vitals taken for this visit. There is no height or weight on file to calculate BMI. Physical Exam Vitals and nursing note reviewed.  Constitutional:      Appearance: Normal appearance. He is well-developed.  HENT:     Head: Normocephalic and atraumatic.     Right Ear: Tympanic membrane and external ear normal.     Left Ear: Tympanic membrane and external ear normal.     Nose: Nose normal.     Mouth/Throat:     Mouth: Mucous membranes are moist.  Pharynx: Oropharynx is clear.  Eyes:     Conjunctiva/sclera: Conjunctivae normal.  Cardiovascular:     Rate and Rhythm: Normal rate and regular rhythm.     Heart sounds: Normal heart sounds. No murmur heard.    No friction rub. No gallop.  Pulmonary:     Effort: Pulmonary effort is normal.     Breath sounds: Normal breath sounds. No wheezing, rhonchi or rales.  Musculoskeletal:     Cervical back: Neck supple.  Skin:    General: Skin is warm.     Findings: No rash.  Neurological:     Mental Status: He is alert and oriented to person, place, and time.  Psychiatric:        Behavior: Behavior normal.  The plan was reviewed with the patient/family, and all questions/concerned were addressed.  It was my pleasure to see Howard [name] today and participate in his care. Please feel free to contact me with  any questions or concerns.  Sincerely,  Rexene Alberts, DO Allergy & Immunology  [facility] office: [place] office: [phone]

## 2022-06-12 ENCOUNTER — Ambulatory Visit: Payer: Medicaid Other | Admitting: Allergy

## 2022-07-22 DIAGNOSIS — F319 Bipolar disorder, unspecified: Secondary | ICD-10-CM | POA: Diagnosis not present

## 2022-07-22 DIAGNOSIS — F411 Generalized anxiety disorder: Secondary | ICD-10-CM | POA: Diagnosis not present

## 2022-07-22 DIAGNOSIS — F5105 Insomnia due to other mental disorder: Secondary | ICD-10-CM | POA: Diagnosis not present

## 2022-07-22 DIAGNOSIS — F4310 Post-traumatic stress disorder, unspecified: Secondary | ICD-10-CM | POA: Diagnosis not present

## 2022-09-08 ENCOUNTER — Ambulatory Visit: Payer: Medicaid Other | Admitting: Internal Medicine

## 2022-10-23 ENCOUNTER — Encounter: Payer: Self-pay | Admitting: Family Medicine

## 2022-10-23 ENCOUNTER — Ambulatory Visit: Payer: Medicaid Other | Admitting: Family Medicine

## 2022-10-23 ENCOUNTER — Other Ambulatory Visit: Payer: Medicaid Other

## 2022-10-23 VITALS — BP 136/98 | HR 105 | Temp 96.9°F | Resp 20 | Ht 73.0 in | Wt 386.0 lb

## 2022-10-23 DIAGNOSIS — K219 Gastro-esophageal reflux disease without esophagitis: Secondary | ICD-10-CM | POA: Diagnosis not present

## 2022-10-23 DIAGNOSIS — R000 Tachycardia, unspecified: Secondary | ICD-10-CM | POA: Diagnosis not present

## 2022-10-23 DIAGNOSIS — R233 Spontaneous ecchymoses: Secondary | ICD-10-CM | POA: Diagnosis not present

## 2022-10-23 DIAGNOSIS — M1A09X0 Idiopathic chronic gout, multiple sites, without tophus (tophi): Secondary | ICD-10-CM

## 2022-10-23 DIAGNOSIS — F319 Bipolar disorder, unspecified: Secondary | ICD-10-CM

## 2022-10-23 DIAGNOSIS — Z9109 Other allergy status, other than to drugs and biological substances: Secondary | ICD-10-CM | POA: Diagnosis not present

## 2022-10-23 DIAGNOSIS — I10 Essential (primary) hypertension: Secondary | ICD-10-CM

## 2022-10-23 DIAGNOSIS — J4540 Moderate persistent asthma, uncomplicated: Secondary | ICD-10-CM

## 2022-10-23 MED ORDER — HYDROCHLOROTHIAZIDE 25 MG PO TABS: 25.0000 mg | ORAL_TABLET | Freq: Every day | ORAL | 1 refills | Status: DC

## 2022-10-23 MED ORDER — ALBUTEROL SULFATE HFA 108 (90 BASE) MCG/ACT IN AERS: 2.0000 | INHALATION_SPRAY | Freq: Four times a day (QID) | RESPIRATORY_TRACT | 1 refills | Status: DC | PRN

## 2022-10-23 MED ORDER — MOMETASONE FURO-FORMOTEROL FUM 200-5 MCG/ACT IN AERO: 2.0000 | INHALATION_SPRAY | Freq: Two times a day (BID) | RESPIRATORY_TRACT | 3 refills | Status: DC

## 2022-10-23 MED ORDER — OMEPRAZOLE 40 MG PO CPDR: 40.0000 mg | DELAYED_RELEASE_CAPSULE | Freq: Every day | ORAL | 1 refills | Status: DC

## 2022-10-23 MED ORDER — ALLOPURINOL 100 MG PO TABS: 100.0000 mg | ORAL_TABLET | Freq: Every day | ORAL | 1 refills | Status: DC

## 2022-10-23 MED ORDER — AMLODIPINE BESY-BENAZEPRIL HCL 5-10 MG PO CAPS: 1.0000 | ORAL_CAPSULE | Freq: Every day | ORAL | 1 refills | Status: DC
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
## 2022-10-23 NOTE — Patient Instructions (Signed)
Sinus Tachycardia  Sinus tachycardia is a fast heartbeat. In sinus tachycardia, the heart beats more than 100 times a minute. Sinus tachycardia starts in the part of the heart called the sinoatrial (SA) node. Sinus tachycardia may be harmless, or it may be a sign of a serious condition. What are the causes? This condition may be caused by: Exercise or exertion. A fever. Pain. Loss of body fluids (dehydration). Severe bleeding (hemorrhage). Anxiety and stress. Certain substances, including: Alcohol. Caffeine. Tobacco and nicotine products. Cold medicines. Illegal drugs. Medical conditions including: Heart disease. An infection. An overactive thyroid (hyperthyroidism). A lack of red blood cells (anemia). What are the signs or symptoms? Symptoms of this condition include: A feeling that the heart is beating fast or unevenly (palpitations). Suddenly noticing your heartbeat (cardiac awareness). Lightheadedness. Tiredness (fatigue). Shortness of breath. Chest pain. Nausea. Fainting. How is this diagnosed? This condition is diagnosed with: A physical exam. Tests or monitoring, such as: Blood tests. An electrocardiogram (ECG). This test measures the electrical activity of the heart. Ambulatory cardiac monitor. This records your heartbeats for 24 hours or more. You may be referred to a heart specialist (cardiologist). How is this treated? Treatment for this condition depends on the cause. Treatment may involve: Treating the underlying condition. Taking new medicines or changing your current medicines as told by your health care provider. Making changes to your diet or lifestyle. Follow these instructions at home: Lifestyle  Do not use any products that contain nicotine or tobacco. These products include cigarettes, chewing tobacco, and vaping devices, such as e-cigarettes. If you need help quitting, ask your health care provider. Do not use illegal drugs, such as  cocaine. Learn relaxation methods to help you when you get stressed or anxious. These include deep breathing. Avoid caffeine or other stimulants, including herbal stimulants that are found in energy drinks. Alcohol use  Do not drink alcohol if: Your health care provider tells you not to drink. You are pregnant, may be pregnant, or are planning to become pregnant. If you drink alcohol: Limit how much you have to: 0-1 drink a day for women. 0-2 drinks a day for men. Know how much alcohol is in your drink. In the [place] one drink equals one 12 oz bottle of beer (355 mL), one 5 oz glass of wine (148 mL), or one 1 oz glass of hard liquor (44 mL). General instructions Drink enough fluids to keep your urine pale yellow. Take over-the-counter and prescription medicines only as told by your health care provider. Ask your health care provider about taking vitamins, herbs, and supplements. Contact a health care provider if: You have vomiting or diarrhea that does not go away. You have a fever. You have weakness or dizziness. You feel faint. Get help right away if: You have pain in your chest, upper arms, jaw, or neck. You have palpitations that do not go away. Summary In sinus tachycardia, the heart beats more than 100 times a minute. Sinus tachycardia may be harmless, or it may be a sign of a serious condition. Treatment for this condition depends on the cause or the underlying condition. Get help right away if you have pain in your chest, upper arms, jaw, or neck. This information is not intended to replace advice given to you by your health care provider. Make sure you discuss any questions you have with your health care provider. Document Revised: 08/13/2021 Document Reviewed: 08/13/2021 Elsevier Patient Education  2024 [employer] Inc[employer]  

## 2022-10-24 LAB — VITAMIN D 25 HYDROXY (VIT D DEFICIENCY, FRACTURES): Vit D, 25-Hydroxy: 6.2 ng/mL — ABNORMAL LOW (ref 30.0–100.0)

## 2022-10-24 LAB — ANEMIA PROFILE B
Basophils Absolute: 0.1 10*3/uL (ref 0.0–0.2)
Basos: 1 %
EOS (ABSOLUTE): 0.3 10*3/uL (ref 0.0–0.4)
Eos: 3 %
Ferritin: 37 ng/mL (ref 30–400)
Folate: 8.5 ng/mL (ref >3.0)
Hematocrit: 47.1 % (ref 37.5–51.0)
Hemoglobin: 15.4 g/dL (ref 13.0–17.7)
Immature Grans (Abs): 0 10*3/uL (ref 0.0–0.1)
Immature Granulocytes: 0 %
Iron Saturation: 14 % — ABNORMAL LOW (ref 15–55)
Iron: 59 ug/dL (ref 38–169)
Lymphocytes Absolute: 2.6 10*3/uL (ref 0.7–3.1)
Lymphs: 22 %
MCH: 26.5 pg — ABNORMAL LOW (ref 26.6–33.0)
MCHC: 32.7 g/dL (ref 31.5–35.7)
MCV: 81 fL (ref 79–97)
Monocytes Absolute: 0.8 10*3/uL (ref 0.1–0.9)
Monocytes: 7 %
Neutrophils Absolute: 7.9 10*3/uL — ABNORMAL HIGH (ref 1.4–7.0)
Neutrophils: 67 %
Platelets: 351 10*3/uL (ref 150–450)
RBC: 5.81 x10E6/uL — ABNORMAL HIGH (ref 4.14–5.80)
RDW: 13.8 % (ref 11.6–15.4)
Retic Ct Pct: 1.2 % (ref 0.6–2.6)
Total Iron Binding Capacity: 410 ug/dL (ref 250–450)
UIBC: 351 ug/dL — ABNORMAL HIGH (ref 111–343)
Vitamin B-12: 246 pg/mL (ref 232–1245)
WBC: 11.8 10*3/uL — ABNORMAL HIGH (ref 3.4–10.8)

## 2022-10-24 LAB — LIPID PANEL
Chol/HDL Ratio: 4.7 ratio (ref 0.0–5.0)
Cholesterol, Total: 166 mg/dL (ref 100–199)
HDL: 35 mg/dL — ABNORMAL LOW (ref >39)
LDL Chol Calc (NIH): 113 mg/dL — ABNORMAL HIGH (ref 0–99)
Triglycerides: 99 mg/dL (ref 0–149)
VLDL Cholesterol Cal: 18 mg/dL (ref 5–40)

## 2022-10-24 LAB — CMP14+EGFR
ALT: 48 IU/L — ABNORMAL HIGH (ref 0–44)
AST: 20 IU/L (ref 0–40)
Albumin: 4.6 g/dL (ref 4.3–5.2)
Alkaline Phosphatase: 121 IU/L (ref 44–121)
BUN/Creatinine Ratio: 9 (ref 9–20)
BUN: 10 mg/dL (ref 6–20)
Bilirubin Total: 0.4 mg/dL (ref 0.0–1.2)
CO2: 20 mmol/L (ref 20–29)
Calcium: 9.3 mg/dL (ref 8.7–10.2)
Chloride: 103 mmol/L (ref 96–106)
Creatinine, Ser: 1.11 mg/dL (ref 0.76–1.27)
Globulin, Total: 3 g/dL (ref 1.5–4.5)
Glucose: 83 mg/dL (ref 70–99)
Potassium: 4.2 mmol/L (ref 3.5–5.2)
Sodium: 139 mmol/L (ref 134–144)
Total Protein: 7.6 g/dL (ref 6.0–8.5)
eGFR: 92 mL/min/{1.73_m2} (ref >59)

## 2022-10-24 LAB — COAGUCHEK XS/INR WAIVED
INR: 1 (ref 0.9–1.1)
Prothrombin Time: 11.9 s

## 2022-10-24 LAB — TSH: TSH: 1.96 u[IU]/mL (ref 0.450–4.500)

## 2022-10-27 ENCOUNTER — Other Ambulatory Visit: Payer: Self-pay | Admitting: Family Medicine

## 2022-10-27 ENCOUNTER — Encounter: Payer: Self-pay | Admitting: Family Medicine

## 2022-10-27 DIAGNOSIS — R790 Abnormal level of blood mineral: Secondary | ICD-10-CM

## 2022-10-27 DIAGNOSIS — E559 Vitamin D deficiency, unspecified: Secondary | ICD-10-CM

## 2022-10-27 MED ORDER — VITAMIN D (ERGOCALCIFEROL) 1.25 MG (50000 UNIT) PO CAPS
50000.0000 [IU] | ORAL_CAPSULE | ORAL | 0 refills | Status: DC
Start: 2022-10-27 — End: 2023-02-19

## 2022-11-06 DIAGNOSIS — R079 Chest pain, unspecified: Secondary | ICD-10-CM | POA: Diagnosis not present

## 2022-11-06 DIAGNOSIS — R000 Tachycardia, unspecified: Secondary | ICD-10-CM | POA: Diagnosis not present

## 2022-11-06 DIAGNOSIS — K2100 Gastro-esophageal reflux disease with esophagitis, without bleeding: Secondary | ICD-10-CM | POA: Diagnosis not present

## 2022-11-26 ENCOUNTER — Telehealth: Payer: Self-pay | Admitting: *Deleted

## 2022-11-26 NOTE — Telephone Encounter (Signed)
I spoke to pt's grandmother (on [place]) and she states pt had messaged about the monitor falling off (see MyChart message from 10/27/22). Number to contact zio to get replacement given to grandmother and advised once they get the zio to contact office to schedule with triage nurse to have it placed and grandmother voiced understanding.

## 2022-12-05 ENCOUNTER — Ambulatory Visit: Payer: Medicaid Other | Admitting: Internal Medicine

## 2023-01-12 ENCOUNTER — Ambulatory Visit: Payer: Medicaid Other | Admitting: Family Medicine

## 2023-01-12 ENCOUNTER — Ambulatory Visit: Payer: Medicaid Other | Admitting: Internal Medicine

## 2023-01-21 ENCOUNTER — Ambulatory Visit: Payer: Medicaid Other | Admitting: Family Medicine

## 2023-02-09 ENCOUNTER — Ambulatory Visit: Payer: Medicaid Other | Admitting: Internal Medicine

## 2023-02-09 ENCOUNTER — Encounter: Payer: Self-pay | Admitting: Internal Medicine

## 2023-02-09 ENCOUNTER — Other Ambulatory Visit: Payer: Self-pay

## 2023-02-09 VITALS — BP 124/86 | HR 122 | Temp 98.0°F | Resp 16 | Ht 73.0 in | Wt 389.8 lb

## 2023-02-09 DIAGNOSIS — R000 Tachycardia, unspecified: Secondary | ICD-10-CM | POA: Diagnosis not present

## 2023-02-09 DIAGNOSIS — J3081 Allergic rhinitis due to animal (cat) (dog) hair and dander: Secondary | ICD-10-CM | POA: Diagnosis not present

## 2023-02-09 DIAGNOSIS — J301 Allergic rhinitis due to pollen: Secondary | ICD-10-CM

## 2023-02-09 DIAGNOSIS — J3089 Other allergic rhinitis: Secondary | ICD-10-CM | POA: Diagnosis not present

## 2023-02-09 DIAGNOSIS — J4530 Mild persistent asthma, uncomplicated: Secondary | ICD-10-CM | POA: Diagnosis not present

## 2023-02-09 MED ORDER — FEXOFENADINE HCL 180 MG PO TABS
180.0000 mg | ORAL_TABLET | Freq: Every day | ORAL | 5 refills | Status: DC
Start: 2023-02-09 — End: 2024-03-12

## 2023-02-09 MED ORDER — FLUTICASONE PROPIONATE 50 MCG/ACT NA SUSP: 2.0000 | Freq: Every day | NASAL | 5 refills | Status: AC

## 2023-02-09 MED ORDER — ALBUTEROL SULFATE HFA 108 (90 BASE) MCG/ACT IN AERS: 1.0000 | INHALATION_SPRAY | Freq: Four times a day (QID) | RESPIRATORY_TRACT | 1 refills | Status: DC | PRN

## 2023-02-09 MED ORDER — MOMETASONE FURO-FORMOTEROL FUM 200-5 MCG/ACT IN AERO: 2.0000 | INHALATION_SPRAY | Freq: Two times a day (BID) | RESPIRATORY_TRACT | 5 refills | Status: DC

## 2023-02-09 NOTE — Progress Notes (Signed)
NEW PATIENT  Date of Service/Encounter:  02/09/23  Consult requested by: Gabriel Earing, FNP   Subjective:   Howard [name] (DOB: July 30, 1990) is a 32 y.o. male who presents to the clinic on 02/09/2023 with a chief complaint of Allergy Testing, Asthma, Cough, and Nasal Congestion .    History obtained from: chart review and patient.   Asthma:  Diagnosed at a very young age. It used to be a lot worse when he was younger and required frequent nebulizer treatments.  It has slightly improved with age and now notes exercise as a big trigger.  He had exposure to second hand smoke exposure when younger.    Several times of daytime symptoms in past month, none nighttime awakenings in past month Using rescue inhaler 3x/week  Limitations to daily activity: none 0 ED visits/UC visits and 0 oral steroids in the past year 0 number of lifetime hospitalizations, 0 number of lifetime intubations.  Identified Triggers: smoke exposure, activity  Prior PFTs or spirometry: none available for review  Current regimen:  Maintenance: Dulera 200-5 PRN, does forget many times  Rescue: Albuterol 2 puffs q4-6 hrs PRN  Rhinitis:  Started in childhood.  Symptoms include: nasal congestion, rhinorrhea, post nasal drainage, sneezing, and itchy eyes  Occurs with Spring/Fall  Potential triggers: cats   Treatments tried:  Zyrtec PRN; last use was a while ago.  Makes him sleepy/dizzy.   Previous allergy testing: no History of sinus surgery: no Nonallergic triggers: none   Past Medical History: Past Medical History:  Diagnosis Date   Acute lower GI bleeding hospitalized 12/06/2014   Anxiety    Bipolar 1 disorder (HCC)    Childhood asthma    Chronic chest pain    Depression    GERD (gastroesophageal reflux disease)    Headache    "a few times/month" (12/06/2014)   Hypertension    Migraine    "a few times/month" (12/06/2014)   Obese    Pollen allergies    Shortness of breath dyspnea    with exertion    Past Surgical History: Past Surgical History:  Procedure Laterality Date   ESOPHAGOGASTRODUODENOSCOPY N/A 12/07/2014   Procedure: ESOPHAGOGASTRODUODENOSCOPY (EGD);  Surgeon: Graylin Shiver, MD;  Location: [facility] ENDOSCOPY;  Service: Endoscopy;  Laterality: N/A;   FRACTURE SURGERY     ORIF HUMERUS FRACTURE Right 11/30/2014   Procedure: OPEN REDUCTION INTERNAL FIXATION (ORIF) RIGHT Distal HUMERUS FRACTURE;  Surgeon: Jones Broom, MD;  Location: [facility] OR;  Service: Orthopedics;  Laterality: Right;    Family History: Family History  Problem Relation Age of Onset   Hypertension Mother    Cancer Other     Social History:  Flooring in bedroom: wood Pets: cat and dog Tobacco use/exposure: none Job: none  Medication List:  Allergies as of 02/09/2023   No Known Allergies      Medication List        Accurate as of February 09, 2023 11:21 AM. If you have any questions, ask your nurse or doctor.          albuterol 108 (90 Base) MCG/ACT inhaler Commonly known as: VENTOLIN HFA Inhale 1-2 puffs into the lungs every 6 (six) hours as needed for wheezing or shortness of breath. What changed: how much to take Changed by: Birder Robson   allopurinol 100 MG tablet Commonly known as: ZYLOPRIM Take 1 tablet (100 mg total) by mouth daily.   amLODipine-benazepril 5-10 MG capsule Commonly known as: LOTREL Take 1 capsule by mouth  daily.   cetirizine 10 MG tablet Commonly known as: ZYRTEC Take by mouth.   citalopram 10 MG tablet Commonly known as: CELEXA Take 10 mg by mouth daily.   docusate sodium 100 MG capsule Commonly known as: Colace Take 1 capsule (100 mg total) by mouth 3 (three) times daily as needed.   fexofenadine 180 MG tablet Commonly known as: Allegra Allergy Take 1 tablet (180 mg total) by mouth daily. Started by: Birder Robson   fluticasone 50 MCG/ACT nasal spray Commonly known as: FLONASE Place 2 sprays into both nostrils daily. Started by: Birder Robson    hydrochlorothiazide 25 MG tablet Commonly known as: HYDRODIURIL Take 1 tablet (25 mg total) by mouth daily.   lamoTRIgine 200 MG tablet Commonly known as: LAMICTAL Take by mouth.   mometasone-formoterol 200-5 MCG/ACT Aero Commonly known as: DULERA Inhale 2 puffs into the lungs 2 (two) times daily.   omeprazole 40 MG capsule Commonly known as: PRILOSEC Take 1 capsule (40 mg total) by mouth daily.   traZODone 100 MG tablet Commonly known as: DESYREL Take by mouth.   Vitamin D (Ergocalciferol) 1.25 MG (50000 UNIT) Caps capsule Commonly known as: DRISDOL Take 1 capsule (50,000 Units total) by mouth every 7 (seven) days.         REVIEW OF SYSTEMS: Pertinent positives and negatives discussed in HPI.   Objective:   Physical Exam: BP 124/86   Pulse (!) 122   Temp 98 F (36.7 C)   Resp 16   Ht 6\' 1"  (1.854 m)   Wt (!) 389 lb 12.8 oz (176.8 kg)   SpO2 96%   BMI 51.43 kg/m  Body mass index is 51.43 kg/m. GEN: alert, well developed HEENT: clear conjunctiva, TM grey and translucent, nose with + mild inferior turbinate hypertrophy, pink nasal mucosa, slight clear rhinorrhea, + cobblestoning HEART: regular rate and rhythm, no murmur LUNGS: clear to auscultation bilaterally, no coughing, unlabored respiration ABDOMEN: soft, non distended  SKIN: no rashes or lesions  Reviewed:  10/23/2022: seen by Lequita Halt NP for establishing care.  Has Gout, GERD, tachycardia/HTN, asthma, bipolar disorder. On amlodipine-benazepril-hydrochlorothiazide, Omeprazole, Dulera. Referred to Allergy.  Also has tachycardia, prior EKG sinus.  Does have trouble with being nervous   Spirometry:  Tracings reviewed. His effort: Good reproducible efforts. FVC: 5.7L FEV1: 4.62L, 88% predicted FEV1/FVC ratio: 81% Interpretation: Spirometry consistent with normal pattern.  Please see scanned spirometry results for details.  Skin Testing:  Skin prick testing was placed, which includes aeroallergens/foods,  histamine control, and saline control.  Verbal consent was obtained prior to placing test.  Patient tolerated procedure well.  Allergy testing results were read and interpreted by myself, documented by clinical staff. Adequate positive and negative control.  Results discussed with patient/family.  Airborne Adult Perc - 02/09/23 1000     Time Antigen Placed 1015    Allergen Manufacturer Waynette Buttery    Location Back    Number of Test 55    1. Control-Buffer 50% Glycerol Negative    2. Control-Histamine 3+    3. Bahia Negative    4. French Southern Territories Negative    5. Johnson Negative    6. Kentucky Blue Negative    7. Meadow Fescue Negative    8. Perennial Rye Negative    9. Timothy Negative    10. Ragweed Mix Negative    11. Cocklebur Negative    12. Plantain,  English Negative    13. Baccharis Negative    14. Dog Fennel Negative  15. Russian Thistle Negative    16. Lamb's Quarters Negative    17. Sheep Sorrell Negative    18. Rough Pigweed Negative    19. Marsh Elder, Rough Negative    20. Mugwort, Common Negative    21. Box, Elder Negative    22. Cedar, red Negative    23. Sweet Gum Negative    24. Pecan Pollen Negative    25. Pine Mix Negative    26. Walnut, Black Pollen Negative    27. Red Mulberry Negative    28. Ash Mix Negative    29. Birch Mix Negative    30. Beech American 2+    31. Cottonwood, Guinea-Bissau Negative    32. Hickory, White Negative    33. Maple Mix Negative    34. Oak, Guinea-[place]issau Mix Negative    35. Sycamore Eastern Negative    36. Alternaria Alternata Negative    37. Cladosporium Herbarum Negative    38. Aspergillus Mix Negative    39. Penicillium Mix Negative    40. Bipolaris Sorokiniana (Helminthosporium) Negative    41. Drechslera Spicifera (Curvularia) Negative    42. Mucor Plumbeus Negative    43. Fusarium Moniliforme Negative    44. Aureobasidium Pullulans (pullulara) Negative    45. Rhizopus Oryzae Negative    46. Botrytis Cinera Negative    47. Epicoccum  Nigrum Negative    48. Phoma Betae Negative    49. Dust Mite Mix 3+    50. Cat Hair 10,000 BAU/ml 2+    51.  Dog Epithelia Negative    52. Mixed Feathers Negative    53. Horse Epithelia Negative    54. Cockroach, German 3+    55. Tobacco Leaf Negative               Assessment:   1. Mild persistent asthma without complication   2. Allergic rhinitis due to dust mite   3. Seasonal allergic rhinitis due to pollen   4. Allergic rhinitis due to animal hair or dander   5. Allergic rhinitis due to insect   6. Tachycardia     Plan/Recommendations:  Mild Persistent Asthma: - MDI technique discussed.  Spirometry today was normal. Frequent albuterol use so discussed using his controller twice daily rather than PRN.   - Maintenance inhaler: start Dulera 200-65mcg 2 puffs twice daily.  - Rescue inhaler: Albuterol 2 puffs via spacer or 1 vial via nebulizer every 4-6 hours as needed for respiratory symptoms of cough, shortness of breath, or wheezing Asthma control goals:  Full participation in all desired activities (may need albuterol before activity) Albuterol use two times or less a week on average (not counting use with activity) Cough interfering with sleep two times or less a month Oral steroids no more than once a year No hospitalizations  Allergic Rhinitis: - Due to turbinate hypertrophy, seasonal symptoms, uncontrolled asthma and unresponsive to over the counter meds, will perform skin testing to identify aeroallergen triggers.   - Positive skin test 01/2023: trees, dust mites, cats, cockroach  - Avoidance measures discussed. - Use nasal saline rinses before nose sprays such as with Neilmed Sinus Rinse.  Use distilled water.   - Use Flonase 2 sprays each nostril daily. Aim upward and outward. - Use Allegra (Fexofenadine) 180mg  daily.  - Consider allergy shots as long term control of your symptoms by teaching your immune system to be more tolerant of your allergy  triggers  Tachycardia - Persistent on exam today, regular rhythm.  If  this persists, will consider evaluation for obstructive sleep apnea with Pulm.  He does report anxiety especially with physician visit.   ALLERGEN AVOIDANCE MEASURES   Dust Mites Use central air conditioning and heat; and change the filter monthly.  Pleated filters work better than mesh filters.  Electrostatic filters may also be used; wash the filter monthly.  Window air conditioners may be used, but do not clean the air as well as a central air conditioner.  Change or wash the filter monthly. Keep windows closed.  Do not use attic fans.   Encase the mattress, box springs and pillows with zippered, dust proof covers. Wash the bed linens in hot water weekly.   Remove carpet, especially from the bedroom. Remove stuffed animals, throw pillows, dust ruffles, heavy drapes and other items that collect dust from the bedroom. Do not use a humidifier.   Use wood, vinyl or leather furniture instead of cloth furniture in the bedroom. Keep the indoor humidity at 30 - 40%.  Monitor with a humidity gauge.  Cockroach Limit spread of food around the house; especially keep food out of bedrooms. Keep food and garbage in closed containers with a tight lid.  Never leave food out in the kitchen.  Do not leave out pet food or dirty food bowls. Mop the kitchen floor and wash countertops at least once a week. Repair leaky pipes and faucets so there is no standing water to attract roaches. Plug up cracks in the house through which cockroaches can enter. Use bait stations and approved pesticides to reduce cockroach infestation. Pollen Avoidance Pollen levels are highest during the mid-day and afternoon.  Consider this when planning outdoor activities. Avoid being outside when the grass is being mowed, or wear a mask if the pollen-allergic person must be the one to mow the grass. Keep the windows closed to keep pollen outside of the home. Use  an air conditioner to filter the air. Take a shower, wash hair, and change clothing after working or playing outdoors during pollen season. Pet Dander Keep the pet out of your bedroom and restrict it to only a few rooms. Be advised that keeping the pet in only one room will not limit the allergens to that room. Don't pet, hug or kiss the pet; if you do, wash your hands with soap and water. High-efficiency particulate air (HEPA) cleaners run continuously in a bedroom or living room can reduce allergen levels over time. Regular use of a high-efficiency vacuum cleaner or a central vacuum can reduce allergen levels. Giving your pet a bath at least once a week can reduce airborne allergen.    Return in about 6 weeks (around 03/23/2023).  Alesia Morin, MD [facility]

## 2023-02-09 NOTE — Patient Instructions (Addendum)
Mild Persistent Asthma: - Maintenance inhaler: start Dulera 200-46mcg 2 puffs twice daily.  - Rescue inhaler: Albuterol 2 puffs via spacer or 1 vial via nebulizer every 4-6 hours as needed for respiratory symptoms of cough, shortness of breath, or wheezing Asthma control goals:  Full participation in all desired activities (may need albuterol before activity) Albuterol use two times or less a week on average (not counting use with activity) Cough interfering with sleep two times or less a month Oral steroids no more than once a year No hospitalizations  Allergic Rhinitis:  - Positive skin test 01/2023: trees, dust mites, cats, cockroach  - Avoidance measures discussed. - Use nasal saline rinses before nose sprays such as with Neilmed Sinus Rinse.  Use distilled water.   - Use Flonase 2 sprays each nostril daily. Aim upward and outward. - Use Allegra (Fexofenadine) 180mg  daily.  - Consider allergy shots as long term control of your symptoms by teaching your immune system to be more tolerant of your allergy triggers   ALLERGEN AVOIDANCE MEASURES   Dust Mites Use central air conditioning and heat; and change the filter monthly.  Pleated filters work better than mesh filters.  Electrostatic filters may also be used; wash the filter monthly.  Window air conditioners may be used, but do not clean the air as well as a central air conditioner.  Change or wash the filter monthly. Keep windows closed.  Do not use attic fans.   Encase the mattress, box springs and pillows with zippered, dust proof covers. Wash the bed linens in hot water weekly.   Remove carpet, especially from the bedroom. Remove stuffed animals, throw pillows, dust ruffles, heavy drapes and other items that collect dust from the bedroom. Do not use a humidifier.   Use wood, vinyl or leather furniture instead of cloth furniture in the bedroom. Keep the indoor humidity at 30 - 40%.  Monitor with a humidity  gauge.  Cockroach Limit spread of food around the house; especially keep food out of bedrooms. Keep food and garbage in closed containers with a tight lid.  Never leave food out in the kitchen.  Do not leave out pet food or dirty food bowls. Mop the kitchen floor and wash countertops at least once a week. Repair leaky pipes and faucets so there is no standing water to attract roaches. Plug up cracks in the house through which cockroaches can enter. Use bait stations and approved pesticides to reduce cockroach infestation. Pollen Avoidance Pollen levels are highest during the mid-day and afternoon.  Consider this when planning outdoor activities. Avoid being outside when the grass is being mowed, or wear a mask if the pollen-allergic person must be the one to mow the grass. Keep the windows closed to keep pollen outside of the home. Use an air conditioner to filter the air. Take a shower, wash hair, and change clothing after working or playing outdoors during pollen season. Pet Dander Keep the pet out of your bedroom and restrict it to only a few rooms. Be advised that keeping the pet in only one room will not limit the allergens to that room. Don't pet, hug or kiss the pet; if you do, wash your hands with soap and water. High-efficiency particulate air (HEPA) cleaners run continuously in a bedroom or living room can reduce allergen levels over time. Regular use of a high-efficiency vacuum cleaner or a central vacuum can reduce allergen levels. Giving your pet a bath at least once a week can reduce  airborne allergen.

## 2023-02-19 ENCOUNTER — Encounter: Payer: Self-pay | Admitting: Family Medicine

## 2023-02-19 ENCOUNTER — Ambulatory Visit (INDEPENDENT_AMBULATORY_CARE_PROVIDER_SITE_OTHER): Payer: Medicaid Other | Admitting: Family Medicine

## 2023-02-19 VITALS — BP 117/83 | HR 88 | Temp 98.8°F | Ht 73.0 in | Wt 389.5 lb

## 2023-02-19 DIAGNOSIS — R000 Tachycardia, unspecified: Secondary | ICD-10-CM | POA: Diagnosis not present

## 2023-02-19 DIAGNOSIS — R7401 Elevation of levels of liver transaminase levels: Secondary | ICD-10-CM

## 2023-02-19 DIAGNOSIS — I10 Essential (primary) hypertension: Secondary | ICD-10-CM

## 2023-02-19 DIAGNOSIS — Z23 Encounter for immunization: Secondary | ICD-10-CM | POA: Diagnosis not present

## 2023-02-19 DIAGNOSIS — J4540 Moderate persistent asthma, uncomplicated: Secondary | ICD-10-CM | POA: Diagnosis not present

## 2023-02-19 DIAGNOSIS — M1A09X0 Idiopathic chronic gout, multiple sites, without tophus (tophi): Secondary | ICD-10-CM | POA: Diagnosis not present

## 2023-02-19 DIAGNOSIS — F319 Bipolar disorder, unspecified: Secondary | ICD-10-CM

## 2023-02-19 DIAGNOSIS — R202 Paresthesia of skin: Secondary | ICD-10-CM

## 2023-02-19 DIAGNOSIS — K219 Gastro-esophageal reflux disease without esophagitis: Secondary | ICD-10-CM

## 2023-02-19 MED ORDER — PANTOPRAZOLE SODIUM 40 MG PO TBEC: 40.0000 mg | DELAYED_RELEASE_TABLET | Freq: Every day | ORAL | 3 refills | Status: DC

## 2023-02-19 NOTE — Progress Notes (Signed)
Established Patient Office Visit  Subjective   Patient ID: Howard [name], male    DOB: [birth year]  Age: 32 y.o. MRN: [id number]  Chief Complaint  Patient presents with   Medical Management of Chronic Issues   Hypertension    HPI HTN Complaint with meds - Yes Current Medications - lotrel, HCTZ Pertinent ROS:  Fatigue - no Visual Disturbances - No Chest pain - No Dyspnea - No Palpitations - sometimes LE edema - No  Had zio applied at last visit but only last a few days. He called company and they were supposed to send another but never did. Reports heart rate 80-110 at home.   2. GERD Compliant with medications - Yes Current medications - prilosec 40 mg Cough - No Sore throat - No Voice change - No Hemoptysis - No Dysphagia or dyspepsia - Yes Water brash - Yes Red Flags (weight loss, hematochezia, melena, weight loss, early satiety, fevers, odynophagia, or persistent vomiting) - No  He previously failed nexium 40 mg. Has heartburn multiple times a day. LUQ abdominal pain.   3. Asthma He has established with asthma and allergy. Not taking metoprolol has helped with symptoms. He has learned that he is allergic to cats and has 3 cats at home. Has some shortness of breath and wheezing.  He has been compliant with dulera. Hasn't used albuteorl in over a week.   4. Gout On allopurinol. No flares.   5. Anxiety/depression Still seeing behavioral health.   6. Numbness/tingling  Bilateral hands for months, worse at night. Sometimes in ring and pinky fingers, sometimes in thumb. Plays video games a lot. Sometimes has pain in forearms. Hasn't tried any remedies. Denies decreased strenth.      10/23/2022    1:58 PM  Depression screen PHQ 2/9  Decreased Interest 2  Down, Depressed, Hopeless 2  PHQ - 2 Score 4  Altered sleeping 2  Tired, decreased energy 2  Change in appetite 1  Feeling bad or failure about yourself  0  Trouble concentrating 0  Moving slowly or  fidgety/restless 1  Suicidal thoughts 0  PHQ-9 Score 10  Difficult doing work/chores Not difficult at all      10/23/2022    1:59 PM  GAD 7 : Generalized Anxiety Score  Nervous, Anxious, on Edge 1  Control/stop worrying 2  Worry too much - different things 1  Trouble relaxing 1  Restless 1  Easily annoyed or irritable 2  Afraid - awful might happen 1  Total GAD 7 Score 9  Anxiety Difficulty Somewhat difficult     Past Medical History:  Diagnosis Date   Acute lower GI bleeding hospitalized 12/06/2014   Anxiety    Bipolar 1 disorder (HCC)    Childhood asthma    Chronic chest pain    Depression    GERD (gastroesophageal reflux disease)    Headache    "a few times/month" (12/06/2014)   Hypertension    Migraine    "a few times/month" (12/06/2014)   Obese    Pollen allergies    Shortness of breath dyspnea    with exertion      ROS As per HPI.    Objective:     BP 117/83   Pulse 88   Temp 98.8 F (37.1 C) (Temporal)   Ht 6\' 1"  (1.854 m)   Wt (!) 389 lb 8 oz (176.7 kg)   SpO2 96%   BMI 51.39 kg/m    Physical Exam Vitals and nursing  note reviewed.  Constitutional:      General: He is not in acute distress.    Appearance: He is not ill-appearing, toxic-appearing or diaphoretic.  Cardiovascular:     Rate and Rhythm: Normal rate and regular rhythm.     Heart sounds: Normal heart sounds. No murmur heard. Pulmonary:     Effort: Pulmonary effort is normal. No respiratory distress.     Breath sounds: Normal breath sounds. No wheezing.  Abdominal:     General: Bowel sounds are normal. There is no distension.     Tenderness: There is no abdominal tenderness. There is no guarding or rebound.  Musculoskeletal:     Right forearm: Normal.     Left forearm: Normal.     Right wrist: Normal.     Left wrist: Normal.     Right hand: Normal.     Left hand: Normal.     Cervical back: Neck supple. No rigidity.     Right lower leg: No edema.     Left lower leg: No  edema.     Comments: Negative phalen's test  Skin:    General: Skin is warm and dry.  Neurological:     General: No focal deficit present.     Mental Status: He is alert and oriented to person, place, and time.  Psychiatric:        Mood and Affect: Mood normal.        Behavior: Behavior normal.     No results found for any visits on 02/19/23.    The ASCVD Risk score (Arnett DK, et al., 2019) failed to calculate for the following reasons:   The 2019 ASCVD risk score is only valid for ages 31 to 77    Assessment & Plan:   [name] was seen today for medical management of chronic issues and hypertension.  Diagnoses and all orders for this visit:  Primary hypertension Well controlled on current regimen.   Tachycardia He will contact zio regarding replacement. Discussed that zio will be beneficial for further evaluation. HR is normal today with regular rhythm on exam.  -     Anemia Profile B  Morbid obesity (HCC) Diet, exercise, weight loss.   Bipolar I disorder (HCC) Managed by [facility]. Stable. Denies SI.   Gastroesophageal reflux disease, unspecified whether esophagitis present Uncontrolled. Will switch to protonix. Has now failed nexium and omeprazole. -     pantoprazole (PROTONIX) 40 MG tablet; Take 1 tablet (40 mg total) by mouth daily.  Moderate persistent asthma without complication Managed by asthma and allergy. No signs of exacerbation.   Chronic gout of multiple sites, unspecified cause Well controlled on current regimen.   Elevated ALT measurement Will recheck today.  -     CMP14+EGFR  Paresthesia of hand, bilateral Discussed bracing at night, rest. Discussed referral to ortho if no improvement or worsening of symptoms.   Encounter for immunization -     Flu vaccine trivalent PF, 6mos and older(Flulaval,Afluria,Fluarix,Fluzone)   Return in about 3 months (around 05/22/2023) for chronic follow up.   The patient indicates understanding of these issues and  agrees with the plan.  Gabriel Earing, FNP

## 2023-02-20 LAB — ANEMIA PROFILE B
Basophils Absolute: 0.1 10*3/uL (ref 0.0–0.2)
Basos: 1 %
EOS (ABSOLUTE): 0.2 10*3/uL (ref 0.0–0.4)
Eos: 2 %
Ferritin: 55 ng/mL (ref 30–400)
Folate: 5.6 ng/mL (ref >3.0)
Hematocrit: 46.7 % (ref 37.5–51.0)
Hemoglobin: 14.8 g/dL (ref 13.0–17.7)
Immature Grans (Abs): 0 10*3/uL (ref 0.0–0.1)
Immature Granulocytes: 0 %
Iron Saturation: 24 % (ref 15–55)
Iron: 89 ug/dL (ref 38–169)
Lymphocytes Absolute: 2.9 10*3/uL (ref 0.7–3.1)
Lymphs: 26 %
MCH: 26.9 pg (ref 26.6–33.0)
MCHC: 31.7 g/dL (ref 31.5–35.7)
MCV: 85 fL (ref 79–97)
Monocytes Absolute: 0.7 10*3/uL (ref 0.1–0.9)
Monocytes: 7 %
Neutrophils Absolute: 7.1 10*3/uL — ABNORMAL HIGH (ref 1.4–7.0)
Neutrophils: 64 %
Platelets: 322 10*3/uL (ref 150–450)
RBC: 5.51 x10E6/uL (ref 4.14–5.80)
RDW: 14.1 % (ref 11.6–15.4)
Retic Ct Pct: 1.2 % (ref 0.6–2.6)
Total Iron Binding Capacity: 373 ug/dL (ref 250–450)
UIBC: 284 ug/dL (ref 111–343)
Vitamin B-12: 232 pg/mL (ref 232–1245)
WBC: 11 10*3/uL — ABNORMAL HIGH (ref 3.4–10.8)

## 2023-02-20 LAB — CMP14+EGFR
ALT: 32 [IU]/L (ref 0–44)
AST: 16 [IU]/L (ref 0–40)
Albumin: 4.3 g/dL (ref 4.3–5.2)
Alkaline Phosphatase: 107 [IU]/L (ref 44–121)
BUN/Creatinine Ratio: 15 (ref 9–20)
BUN: 15 mg/dL (ref 6–20)
Bilirubin Total: 0.5 mg/dL (ref 0.0–1.2)
CO2: 21 mmol/L (ref 20–29)
Calcium: 9.8 mg/dL (ref 8.7–10.2)
Chloride: 100 mmol/L (ref 96–106)
Creatinine, Ser: 1.03 mg/dL (ref 0.76–1.27)
Globulin, Total: 2.9 g/dL (ref 1.5–4.5)
Glucose: 87 mg/dL (ref 70–99)
Potassium: 3.9 mmol/L (ref 3.5–5.2)
Sodium: 137 mmol/L (ref 134–144)
Total Protein: 7.2 g/dL (ref 6.0–8.5)
eGFR: 100 mL/min/{1.73_m2} (ref >59)

## 2023-04-06 ENCOUNTER — Ambulatory Visit: Payer: Medicaid Other | Admitting: Nurse Practitioner

## 2023-04-07 ENCOUNTER — Encounter: Payer: Self-pay | Admitting: Family Medicine

## 2023-04-07 DIAGNOSIS — Z6841 Body Mass Index (BMI) 40.0 and over, adult: Secondary | ICD-10-CM | POA: Diagnosis not present

## 2023-04-07 DIAGNOSIS — E66813 Obesity, class 3: Secondary | ICD-10-CM | POA: Diagnosis not present

## 2023-04-07 DIAGNOSIS — F319 Bipolar disorder, unspecified: Secondary | ICD-10-CM | POA: Diagnosis not present

## 2023-04-07 DIAGNOSIS — F5105 Insomnia due to other mental disorder: Secondary | ICD-10-CM | POA: Diagnosis not present

## 2023-04-07 DIAGNOSIS — F4310 Post-traumatic stress disorder, unspecified: Secondary | ICD-10-CM | POA: Diagnosis not present

## 2023-04-07 DIAGNOSIS — F411 Generalized anxiety disorder: Secondary | ICD-10-CM | POA: Diagnosis not present

## 2023-04-07 NOTE — Progress Notes (Unsigned)
   [address] [place]  [place] [address] Dept: [phone]  FOLLOW UP NOTE  Patient ID: Howard [name], male    DOB: [birth year]  Age: 32 y.o. MRN: [id number] Date of Office Visit: 04/08/2023  Assessment  Chief Complaint: No chief complaint on file.  HPI Howard [name] is a 32 year old male who presents to the clinic for follow-up visit.  He was last seen in this clinic on 02/09/2023 by Dr. Allena Katz as a new patient for evaluation of asthma and allergic rhinitis.  His last environmental allergy skin testing was on 02/09/2023 was positive to tree pollen, dust mite, cat, and cockroach.  Consistent tachycardia was noted throughout his last clinic visit.  Discussed the use of AI scribe software for clinical note transcription with the patient, who gave verbal consent to proceed.  History of Present Illness             Drug Allergies:  No Known Allergies  Physical Exam: There were no vitals taken for this visit.   Physical Exam  Diagnostics:    Assessment and Plan: No diagnosis found.  No orders of the defined types were placed in this encounter.   There are no Patient Instructions on file for this visit.  No follow-ups on file.    Thank you for the opportunity to care for this patient.  Please do not hesitate to contact me with questions.  Thermon Leyland, FNP [facility]

## 2023-04-07 NOTE — Patient Instructions (Incomplete)
Asthma Continue Dulera 200-2 puffs twice a day with a spacer to prevent cough or wheeze Continue albuterol 2 puffs once every 4 hours as needed for cough or wheeze You may use albuterol 2 puffs 5 to 15 minutes before activity to decrease cough or wheeze  Allergic rhinitis Continue allergen avoidance measures directed toward tree pollen, dust mite, cat, and cockroach as listed below Continue Allegra 180 mg once a day as needed for runny nose or itch Continue Flonase 2 sprays in each nostril once a day as needed for stuffy nose.  In the right nostril, point the applicator out toward the right ear. In the left nostril, point the applicator out toward the left ear Consider saline nasal rinses as needed for nasal symptoms. Use this before any medicated nasal sprays for best result  Tachycardia Follow-up with your pulmonary specialist for possible sleep apnea evaluation  Call the clinic if this treatment plan is not working well for you.  Follow up in *** or sooner if needed.  Reducing Pollen Exposure The American Academy of Allergy, Asthma and Immunology suggests the following steps to reduce your exposure to pollen during allergy seasons. Do not hang sheets or clothing out to dry; pollen may collect on these items. Do not mow lawns or spend time around freshly cut grass; mowing stirs up pollen. Keep windows closed at night.  Keep car windows closed while driving. Minimize morning activities outdoors, a time when pollen counts are usually at their highest. Stay indoors as much as possible when pollen counts or humidity is high and on windy days when pollen tends to remain in the air longer. Use air conditioning when possible.  Many air conditioners have filters that trap the pollen spores. Use a HEPA room air filter to remove pollen form the indoor air you breathe.   Control of Dust Mite Allergen Dust mites play a major role in allergic asthma and rhinitis. They occur in environments with  high humidity wherever human skin is found. Dust mites absorb humidity from the atmosphere (ie, they do not drink) and feed on organic matter (including shed human and animal skin). Dust mites are a microscopic type of insect that you cannot see with the naked eye. High levels of dust mites have been detected from mattresses, pillows, carpets, upholstered furniture, bed covers, clothes, soft toys and any woven material. The principal allergen of the dust mite is found in its feces. A gram of dust may contain 1,000 mites and 250,000 fecal particles. Mite antigen is easily measured in the air during house cleaning activities. Dust mites do not bite and do not cause harm to humans, other than by triggering allergies/asthma.  Ways to decrease your exposure to dust mites in your home:  1. Encase mattresses, box springs and pillows with a mite-impermeable barrier or cover  2. Wash sheets, blankets and drapes weekly in hot water (130 F) with detergent and dry them in a dryer on the hot setting.  3. Have the room cleaned frequently with a vacuum cleaner and a damp dust-mop. For carpeting or rugs, vacuuming with a vacuum cleaner equipped with a high-efficiency particulate air (HEPA) filter. The dust mite allergic individual should not be in a room which is being cleaned and should wait 1 hour after cleaning before going into the room.  4. Do not sleep on upholstered furniture (eg, couches).  5. If possible removing carpeting, upholstered furniture and drapery from the home is ideal. Horizontal blinds should be eliminated in the  rooms where the person spends the most time (bedroom, study, television room). Washable vinyl, roller-type shades are optimal.  6. Remove all non-washable stuffed toys from the bedroom. Wash stuffed toys weekly like sheets and blankets above.  7. Reduce indoor humidity to less than 50%. Inexpensive humidity monitors can be purchased at most hardware stores. Do not use a humidifier as  can make the problem worse and are not recommended.  Control of Dog or Cat Allergen Avoidance is the best way to manage a dog or cat allergy. If you have a dog or cat and are allergic to dog or cats, consider removing the dog or cat from the home. If you have a dog or cat but don't want to find it a new home, or if your family wants a pet even though someone in the household is allergic, here are some strategies that may help keep symptoms at bay:  Keep the pet out of your bedroom and restrict it to only a few rooms. Be advised that keeping the dog or cat in only one room will not limit the allergens to that room. Don't pet, hug or kiss the dog or cat; if you do, wash your hands with soap and water. High-efficiency particulate air (HEPA) cleaners run continuously in a bedroom or living room can reduce allergen levels over time. Regular use of a high-efficiency vacuum cleaner or a central vacuum can reduce allergen levels. Giving your dog or cat a bath at least once a week can reduce airborne allergen.  Control of Cockroach Allergen Cockroach allergen has been identified as an important cause of acute attacks of asthma, especially in urban settings.  There are fifty-five species of cockroach that exist in the [place], however only three, the [place], [place] species produce allergen that can affect patients with Asthma.  Allergens can be obtained from fecal particles, egg casings and secretions from cockroaches.    Remove food sources. Reduce access to water. Seal access and entry points. Spray runways with 0.5-1% Diazinon or Chlorpyrifos Blow boric acid power under stoves and refrigerator. Place bait stations (hydramethylnon) at feeding sites.

## 2023-04-08 ENCOUNTER — Ambulatory Visit: Payer: Medicaid Other | Admitting: Family Medicine

## 2023-04-13 ENCOUNTER — Ambulatory Visit: Payer: Medicaid Other | Admitting: Family Medicine

## 2023-04-14 ENCOUNTER — Other Ambulatory Visit: Payer: Self-pay | Admitting: Family Medicine

## 2023-04-14 DIAGNOSIS — M1A09X0 Idiopathic chronic gout, multiple sites, without tophus (tophi): Secondary | ICD-10-CM

## 2023-04-30 ENCOUNTER — Encounter: Payer: Self-pay | Admitting: Family Medicine

## 2023-04-30 ENCOUNTER — Ambulatory Visit: Payer: Medicaid Other | Admitting: Family Medicine

## 2023-04-30 VITALS — BP 127/88 | HR 103 | Temp 97.3°F | Ht 73.0 in | Wt >= 6400 oz

## 2023-04-30 DIAGNOSIS — R600 Localized edema: Secondary | ICD-10-CM | POA: Diagnosis not present

## 2023-04-30 DIAGNOSIS — R000 Tachycardia, unspecified: Secondary | ICD-10-CM | POA: Diagnosis not present

## 2023-04-30 DIAGNOSIS — R9431 Abnormal electrocardiogram [ECG] [EKG]: Secondary | ICD-10-CM | POA: Diagnosis not present

## 2023-04-30 DIAGNOSIS — I10 Essential (primary) hypertension: Secondary | ICD-10-CM

## 2023-04-30 DIAGNOSIS — L819 Disorder of pigmentation, unspecified: Secondary | ICD-10-CM

## 2023-04-30 NOTE — Patient Instructions (Addendum)
 Lymphedema  Lymphedema is swelling that happens when an abnormal amount of lymph collects in the soft tissues under your skin. Lymph is fluid that moves through your lymphatic system. This system: Is part of your body's defense system, also called your immune system. Filters germs and waste from tissues in your body to your bloodstream. Lymphedema happens when your lymphatic system is blocked. This keeps lymph from draining as it should and leads to swelling. What are the causes? The cause of lymphedema depends on which type you have. Primary lymphedema is when you're born without lymph vessels or with lymph vessels that aren't normal. Secondary lymphedema is more common. It happens when lymph vessels are blocked or damaged from: Infection. Injury. Radiation therapy. Cancer. Scar tissue that forms. Surgery. What are the signs or symptoms? A swollen arm, leg, feet, toes, or fingers. A heavy or tight feeling in the swollen area. Skin that turns red near the swollen area. Not being able to move your arm or leg. Your arm or leg is sensitive to touch. Discomfort in your arm or leg. How is this diagnosed? Lymphedema may be diagnosed based on: Your symptoms and medical history. A physical exam. Bioimpedance spectroscopy. This test uses painless electrical currents. They help measure fluid levels in your body. Imaging tests, such as: MRI or CT scan. Duplex ultrasound. This test uses sound waves to make pictures on a screen. The pictures show your blood vessels and blood flow. Lymphoscintigraphy. In this test, a low dose of radioactive substance is given through a needle that goes through your skin. The substance traces the flow of lymph through your lymph vessels. Lymphangiography. In this test, a contrast dye is put into your lymph vessel. The dye helps show if the vessel is blocked. How is this treated?  If another condition is causing your lymphedema, that condition will be treated.  For example, antibiotics may be used to treat infection. Treatment for lymphedema depends on the cause. Treatment may include: Complete decongestive therapy (CDT). This lowers fluid buildup. CDT includes: Pressure (compression) wrapping of the area. Manual lymph drainage. This helps lymph drain out of your arm or leg. Certain exercises. These help fluid move out of your arm or leg. Compression. This puts pressure on your arm or leg to lower swelling. It includes: Compression stockings or sleeves. Special bandage wraps. Surgery. This is normally done only for severe cases that don't get better with other treatments. Follow these instructions at home: Self-care Your swollen area is more likely to get hurt or infected. To help prevent infection: Keep the area clean and dry. Use creams or lotions that your health care provider says are okay. These keep your skin moist. Protect your skin from cuts. Use gloves when you cook or garden. Do not walk barefoot. If you shave the area, use an neurosurgeon. Do not wear tight clothes, shoes, or jewelry. Eat a healthy diet. Eat a lot of fruits and vegetables. Activity Do exercises as told by your provider. Do not sit with your legs crossed. When you can, keep the swollen leg or foot raised above the level of your heart. Avoid using an arm with lymphedema to carry things. General instructions Wear compression stockings or sleeves as told by your provider. Note any changes in size of the swollen arm or leg. You may be told to measure it at set times and track the results. Take over-the-counter and prescription medicines only as told by your provider. If you were prescribed antibiotics, use them  as told by your provider. Do not stop using the antibiotic even if you start to feel better or if your condition improves. Do not use heating pads or ice packs on the swollen area. Avoid having your swollen arm or leg used for: Blood draws. IVs. Blood  pressure checks. Contact a health care provider if: You get new swelling in your arm or leg all of a sudden. Fluid leaks from the skin of your swollen arm or leg. You have a cut that doesn't heal. The swollen area hurts or turns red. You get purple spots, a rash, blisters, or sores on your swollen arm or leg. You have a fever or chills. This information is not intended to replace advice given to you by your health care provider. Make sure you discuss any questions you have with your health care provider. Document Revised: 07/09/2022 Document Reviewed: 07/09/2022 Elsevier Patient Education  2024 Elsevier Inc.  Temporomandibular Joint Syndrome  Temporomandibular joint syndrome (TMJ syndrome) is a condition that causes pain in the temporomandibular joints. These joints are located near your ears and allow your jaw to open and close. For people with TMJ syndrome, chewing, biting, or other movements of the jaw can be difficult or painful. TMJ syndrome is often mild and goes away within a few weeks. However, sometimes the condition becomes a long-term (chronic) problem. What are the causes? This condition may be caused by: Grinding your teeth or clenching your jaw. Some people do this when they are stressed. Arthritis. An injury to the jaw. A head or neck injury. Teeth or dentures that are not aligned well. In some cases, the cause of TMJ syndrome may not be known. What are the signs or symptoms? The most common symptom of this condition is aching pain on the side of the head in the area of the TMJ. Other symptoms may include: Pain when moving your jaw, such as when chewing or biting. Not being able to open your jaw all the way. Making a clicking sound when you open your mouth. Headache. Earache. Neck or shoulder pain. How is this diagnosed? This condition may be diagnosed based on: Your symptoms and medical history. A physical exam. Your health care provider may check the range of  motion of your jaw. Imaging tests, such as X-rays or an MRI. You may also need to see your dentist, who will check if your teeth and jaw are lined up correctly. How is this treated? TMJ syndrome often goes away on its own. If treatment is needed, it may include: Eating soft foods and applying ice or heat. Medicines to relieve pain or inflammation. Medicines or massage to relax the muscles. A splint, bite plate, or mouthpiece to prevent teeth grinding or jaw clenching. Relaxation techniques or counseling to help reduce stress. A therapy for pain in which an electrical current is applied to the nerves through the skin (transcutaneous electrical nerve stimulation). Acupuncture. This may help to relieve pain. Jaw surgery. This is rarely needed. Follow these instructions at home:  Eating and drinking Eat a soft diet if you are having trouble chewing. Avoid foods that require a lot of chewing. Do not chew gum. General instructions Take over-the-counter and prescription medicines only as told by your health care provider. If directed, put ice on the painful area. To do this: Put ice in a plastic bag. Place a towel between your skin and the bag. Leave the ice on for 20 minutes, 2-3 times a day. Remove the ice if your  skin turns bright red. This is very important. If you cannot feel pain, heat, or cold, you have a greater risk of damage to the area. Apply a warm, wet cloth (warm compress) to the painful area as told. Massage your jaw area and do any jaw stretching exercises as told by your health care provider. If you were given a splint, bite plate, or mouthpiece, wear it as told by your health care provider. Keep all follow-up visits. This is important. Where to find more information [employer]: [employer]za Contact a health care provider if: You have trouble eating. You have new or worsening symptoms. Get help right away if: Your jaw  locks. Summary Temporomandibular joint syndrome (TMJ syndrome) is a condition that causes pain in the temporomandibular joints. These joints are located near your ears and allow your jaw to open and close. TMJ syndrome is often mild and goes away within a few weeks. However, sometimes the condition becomes a long-term (chronic) problem. Symptoms include an aching pain on the side of the head in the area of the TMJ, pain when chewing or biting, and being unable to open your jaw all the way. You may also make a clicking sound when you open your mouth. TMJ syndrome often goes away on its own. If treatment is needed, it may include medicines to relieve pain, reduce inflammation, or relax the muscles. A splint, bite plate, or mouthpiece may also be used to prevent teeth grinding or jaw clenching. This information is not intended to replace advice given to you by your health care provider. Make sure you discuss any questions you have with your health care provider. Document Revised: 11/25/2020 Document Reviewed: 11/25/2020 Elsevier Patient Education  2024 [employer].

## 2023-04-30 NOTE — Progress Notes (Signed)
 Acute Office Visit  Subjective:     Patient ID: Howard [name], male    DOB: [birth year], 33 y.o.   MRN: [id number]  Chief Complaint  Patient presents with   Nevus    HPI Patient is in today for a referral to dermatology. He has a raised mole of the right side of his neck that has been present since childhood. Over the last few months there has been an increase in size. A portion of the mole also seemed to peel off a few months ago.   He also reports a sharp stabbing pain around his temple/ear area. Occurs on both side, but only 1 side at a time. It lasts for 5 minutes then goes away on its own. No other symptoms with it. Happens 4x a a week or so for the last 2 months. Has popping/clicking in jaw. Denies jaw pain, drainage from ears, eye pain, changes in vision or hearing, numbness, or tingling. Denies aggravating or triggering events. Hasn't tried any remedies.   Reports that he has swelling of his right leg for years. Feels heavy and fatigued at times. Has not worsened. Has mentioned this to previous provider but has not had a work up. Denies tenderness, pain, erythema. He also has noticed swelling in his right arm. This has worsened over the last few months. Again, no erythema, tenderness, pain. Hx of surgery of right arm due to fracture of humerus in 2016 with rod in placed. Has large scar from surgery. No personal hx of blood clots. No known family hx of blood clots.   Continues to have intermittent palpitations. No symptoms with this. Zio was previously placed but fell off shortly after application. Reports he has notified zio bu they have not sent another out to him. Hasn't been evaluated by cardiology before. Grandfather had arrhthymias ? A. Fib. Reports he has had EKG in past with Right BBB.   He has increased exercise since his last visit and has been following a Mediterranean diet. He is surprised that he has gained 11 lbs since his last visit.   ROS As per HPI.      Objective:     BP 127/88   Pulse (!) 103   Temp (!) 97.3 F (36.3 C) (Temporal)   Ht 6' 1 (1.854 m)   Wt (!) 400 lb 6.4 oz (181.6 kg)   SpO2 96%   BMI 52.83 kg/m  Wt Readings from Last 3 Encounters:  04/30/23 (!) 400 lb 6.4 oz (181.6 kg)  02/19/23 (!) 389 lb 8 oz (176.7 kg)  02/09/23 (!) 389 lb 12.8 oz (176.8 kg)      Physical Exam Vitals and nursing note reviewed.  Constitutional:      General: He is not in acute distress.    Appearance: He is obese. He is not ill-appearing, toxic-appearing or diaphoretic.  Cardiovascular:     Rate and Rhythm: Regular rhythm. Tachycardia present.     Heart sounds: Normal heart sounds. No murmur heard. Pulmonary:     Effort: Pulmonary effort is normal. No respiratory distress.     Breath sounds: Normal breath sounds. No wheezing, rhonchi or rales.  Musculoskeletal:     Right upper arm: Deformity (large surgical scar) present. No tenderness.     Left upper arm: No deformity or tenderness.     Right forearm: Swelling present. No edema, deformity, lacerations, tenderness or bony tenderness.     Left forearm: No swelling, edema, deformity, lacerations, tenderness or bony tenderness.  Right lower leg: Swelling present. No deformity, lacerations, tenderness or bony tenderness. No edema.     Left lower leg: No swelling, deformity, lacerations, tenderness or bony tenderness. No edema.     Comments: R forearm girth: 38 cm L forearm girth: 33 cm  RLE girth: 57 cm. No warmth, erythema, tenderness. No pitting edema.  LLE girth: 33 cm. No warmth, erythema, tenderness. No pitting edema.  Skin:    General: Skin is warm and dry.     Findings: Lesion (Right side of neck. See picture below.) present.  Neurological:     General: No focal deficit present.     Mental Status: He is alert and oriented to person, place, and time.  Psychiatric:        Mood and Affect: Mood normal.        Behavior: Behavior normal.     No results found for any visits on  04/30/23.      Assessment & Plan:   Sylvanus was seen today for nevus.  Diagnoses and all orders for this visit:  Primary hypertension BP at goal today.   Tachycardia Abnormal EKG EKG with NSR today with right BBB. No significant changes from previous. Echo discussed and ordered. Referral to cardiology discussed and placed.  -     EKG 12-Lead -     ECHOCARDIOGRAM COMPLETE; Future -     Ambulatory referral to Cardiology  Morbid obesity (HCC) Trending up. Diet, exercise, weight loss. Labs pending as below.   Atypical pigmented skin lesion -     Ambulatory referral to Dermatology  Edema of right lower leg Edema of right forearm Suspect lymphedema. US  for right lower leg ordered for possible venous insuffiencey. Low risk per Well criteria for DVT. Echo has been ordered given hx of tachycardia as well.  -     US  Venous Img Lower Unilateral Right; Future -     CBC with Differential/Platelet -     CMP14+EGFR -     TSH   Return if symptoms worsen or fail to improve.  Keep scheduled follow up appt.   Annabella CHRISTELLA Search, FNP

## 2023-05-01 LAB — CBC WITH DIFFERENTIAL/PLATELET
Basophils Absolute: 0.1 10*3/uL (ref 0.0–0.2)
Basos: 1 %
EOS (ABSOLUTE): 0.1 10*3/uL (ref 0.0–0.4)
Eos: 1 %
Hematocrit: 49.2 % (ref 37.5–51.0)
Hemoglobin: 15.1 g/dL (ref 13.0–17.7)
Immature Grans (Abs): 0 10*3/uL (ref 0.0–0.1)
Immature Granulocytes: 0 %
Lymphocytes Absolute: 2.3 10*3/uL (ref 0.7–3.1)
Lymphs: 21 %
MCH: 26.1 pg — ABNORMAL LOW (ref 26.6–33.0)
MCHC: 30.7 g/dL — ABNORMAL LOW (ref 31.5–35.7)
MCV: 85 fL (ref 79–97)
Monocytes Absolute: 0.9 10*3/uL (ref 0.1–0.9)
Monocytes: 9 %
Neutrophils Absolute: 7.4 10*3/uL — ABNORMAL HIGH (ref 1.4–7.0)
Neutrophils: 68 %
Platelets: 335 10*3/uL (ref 150–450)
RBC: 5.78 x10E6/uL (ref 4.14–5.80)
RDW: 13.3 % (ref 11.6–15.4)
WBC: 10.9 10*3/uL — ABNORMAL HIGH (ref 3.4–10.8)

## 2023-05-01 LAB — CMP14+EGFR
ALT: 28 [IU]/L (ref 0–44)
AST: 14 [IU]/L (ref 0–40)
Albumin: 4.2 g/dL — ABNORMAL LOW (ref 4.3–5.2)
Alkaline Phosphatase: 109 [IU]/L (ref 44–121)
BUN/Creatinine Ratio: 10 (ref 9–20)
BUN: 11 mg/dL (ref 6–20)
Bilirubin Total: 0.2 mg/dL (ref 0.0–1.2)
CO2: 25 mmol/L (ref 20–29)
Calcium: 9.8 mg/dL (ref 8.7–10.2)
Chloride: 100 mmol/L (ref 96–106)
Creatinine, Ser: 1.08 mg/dL (ref 0.76–1.27)
Globulin, Total: 3.1 g/dL (ref 1.5–4.5)
Glucose: 75 mg/dL (ref 70–99)
Potassium: 4.5 mmol/L (ref 3.5–5.2)
Sodium: 139 mmol/L (ref 134–144)
Total Protein: 7.3 g/dL (ref 6.0–8.5)
eGFR: 95 mL/min/{1.73_m2} (ref >59)

## 2023-05-01 LAB — TSH: TSH: 2.22 u[IU]/mL (ref 0.450–4.500)

## 2023-05-13 ENCOUNTER — Ambulatory Visit (HOSPITAL_COMMUNITY)
Admission: RE | Admit: 2023-05-13 | Discharge: 2023-05-13 | Disposition: A | Discharge status: Home / Self Care | Payer: Medicaid Other | Source: Ambulatory Visit | Attending: Family Medicine | Admitting: Family Medicine

## 2023-05-13 DIAGNOSIS — R9431 Abnormal electrocardiogram [ECG] [EKG]: Secondary | ICD-10-CM | POA: Insufficient documentation

## 2023-05-13 DIAGNOSIS — R000 Tachycardia, unspecified: Secondary | ICD-10-CM | POA: Insufficient documentation

## 2023-05-13 LAB — ECHOCARDIOGRAM COMPLETE
AR max vel: 3.43 cm2
AV Area VTI: 3.63 cm2
AV Area mean vel: 3.62 cm2
AV Mean grad: 2 mm[Hg]
AV Peak grad: 4.6 mm[Hg]
Ao pk vel: 1.07 m/s
Area-P 1/2: 4.8 cm2
Calc EF: 56.9 %
MV VTI: 3.11 cm2
S' Lateral: 4 cm
Single Plane A2C EF: 55.9 %
Single Plane A4C EF: 58.2 %

## 2023-05-13 NOTE — Progress Notes (Signed)
  Echocardiogram 2D Echocardiogram has been performed.  Tamula Morrical L Jackqueline Aquilar RDCS 05/13/2023, 11:23 AM

## 2023-05-22 ENCOUNTER — Ambulatory Visit: Payer: Medicaid Other | Admitting: Family Medicine

## 2023-05-22 ENCOUNTER — Encounter: Payer: Self-pay | Admitting: Family Medicine

## 2023-05-22 VITALS — BP 123/87 | HR 89 | Temp 97.5°F | Ht 73.0 in | Wt >= 6400 oz

## 2023-05-22 DIAGNOSIS — K219 Gastro-esophageal reflux disease without esophagitis: Secondary | ICD-10-CM

## 2023-05-22 DIAGNOSIS — M1A09X0 Idiopathic chronic gout, multiple sites, without tophus (tophi): Secondary | ICD-10-CM | POA: Diagnosis not present

## 2023-05-22 DIAGNOSIS — I10 Essential (primary) hypertension: Secondary | ICD-10-CM

## 2023-05-22 DIAGNOSIS — F319 Bipolar disorder, unspecified: Secondary | ICD-10-CM

## 2023-05-22 DIAGNOSIS — R600 Localized edema: Secondary | ICD-10-CM | POA: Diagnosis not present

## 2023-05-22 DIAGNOSIS — R000 Tachycardia, unspecified: Secondary | ICD-10-CM

## 2023-05-22 MED ORDER — FAMOTIDINE 40 MG PO TABS: 20.0000 mg | ORAL_TABLET | Freq: Two times a day (BID) | ORAL | 0 refills | Status: DC

## 2023-05-22 MED ORDER — HYDROCHLOROTHIAZIDE 25 MG PO TABS: 25.0000 mg | ORAL_TABLET | Freq: Every day | ORAL | 1 refills | Status: DC

## 2023-05-22 NOTE — Progress Notes (Unsigned)
   Established Patient Office Visit  Subjective   Patient ID: Howard [name], male    DOB: 08/29/1990  Age: 33 y.o. MRN: [id number]  Chief Complaint  Patient presents with   Hypertension    HPI  HTN Complaint with meds - {yes no free text:20080} Current Medications - *** Checking BP at home ranging *** Exercising Regularly - {yes no free text:20080} Watching Salt intake - {yes no free text:20080} Pertinent ROS:  Headache - {yes no free text:20080} Fatigue - {yes no free text:20080} Visual Disturbances - No Chest pain - {yes no free text:20080} Dyspnea - No Palpitations - No LE edema - {yes no free text:20080}  2. Gout Had 1 flare last months.   3. GERD Compliant with medications - {yes no free text:20080} Current medications - *** Adverse side effects - {yes no free text:20080} DEXA if on PPI - {dexa:315725}. Cough - {yes no free text:20080} Sore throat - {yes no free text:20080} Voice change - {yes no free text:20080} Hemoptysis - {yes no free text:20080} Dysphagia or dyspepsia - {yes no free text:20080} Water brash - {yes no free text:20080} Red Flags (weight loss, hematochezia, melena, weight loss, early satiety, fevers, odynophagia, or persistent vomiting) - {yes no free text:20080}  {History (Optional):23778}  ROS    Objective:     BP 123/87   Pulse 89   Temp (!) 97.5 F (36.4 C) (Temporal)   Ht 6\' 1"  (1.854 m)   Wt (!) 404 lb 3.2 oz (183.3 kg)   SpO2 97%   BMI 53.33 kg/m  Wt Readings from Last 3 Encounters:  05/22/23 (!) 404 lb 3.2 oz (183.3 kg)  04/30/23 (!) 400 lb 6.4 oz (181.6 kg)  02/19/23 (!) 389 lb 8 oz (176.7 kg)      Physical Exam   No results found for any visits on 05/22/23.  {Labs (Optional):23779}  The ASCVD Risk score (Arnett DK, et al., 2019) failed to calculate for the following reasons:   The 2019 ASCVD risk score is only valid for ages 58 to 51    Assessment & Plan:   There are no diagnoses linked to this encounter.    No follow-ups on file.    Gabriel Earing, FNP

## 2023-05-22 NOTE — Patient Instructions (Signed)
Call [facility] at [phone] for an appointment.

## 2023-06-19 ENCOUNTER — Ambulatory Visit: Payer: Medicaid Other | Admitting: Allergy & Immunology

## 2023-07-06 DIAGNOSIS — F319 Bipolar disorder, unspecified: Secondary | ICD-10-CM | POA: Diagnosis not present

## 2023-07-06 DIAGNOSIS — F4310 Post-traumatic stress disorder, unspecified: Secondary | ICD-10-CM | POA: Diagnosis not present

## 2023-07-06 DIAGNOSIS — Z6841 Body Mass Index (BMI) 40.0 and over, adult: Secondary | ICD-10-CM | POA: Diagnosis not present

## 2023-07-06 DIAGNOSIS — F50811 Binge eating disorder, moderate: Secondary | ICD-10-CM | POA: Diagnosis not present

## 2023-07-06 DIAGNOSIS — F900 Attention-deficit hyperactivity disorder, predominantly inattentive type: Secondary | ICD-10-CM | POA: Diagnosis not present

## 2023-07-06 DIAGNOSIS — F5105 Insomnia due to other mental disorder: Secondary | ICD-10-CM | POA: Diagnosis not present

## 2023-07-06 DIAGNOSIS — F411 Generalized anxiety disorder: Secondary | ICD-10-CM | POA: Diagnosis not present

## 2023-07-06 DIAGNOSIS — E66813 Obesity, class 3: Secondary | ICD-10-CM | POA: Diagnosis not present

## 2023-07-08 ENCOUNTER — Telehealth: Payer: Self-pay | Admitting: Family Medicine

## 2023-07-08 NOTE — Telephone Encounter (Unsigned)
 Copied from CRM [id number]. Topic: Referral - Status >> Jul 08, 2023  2:25 PM Tiffany S wrote: Reason for CRM: Patient is calling about referral that  was sent over to the Dermatologist. Patient stated that the specialist never received the referral. Requesting call back

## 2023-07-08 NOTE — Telephone Encounter (Signed)
 Per Referral Notes and Fax under Media Tab in Patient's Chart, Referral was received and Office has tried to contact Patient multiple times. Patient will need to call their Office back to reschedule. [phone].

## 2023-07-10 ENCOUNTER — Telehealth: Payer: Self-pay | Admitting: Family Medicine

## 2023-07-10 NOTE — Telephone Encounter (Signed)
 Copied from CRM [id number]. Topic: Referral - Request for Referral >> Jul 09, 2023  5:30 PM Eunice Blase wrote: Did the patient discuss referral with their provider in the last year? Yes (If No - schedule appointment) (If Yes - send message)  Appointment offered? Yes  Type of order/referral and detailed reason for visit: [facility]  Preference of office, provider, location: [facility]  If referral order, have you been seen by this specialty before? No (If Yes, this issue or another issue? When? Where?  Can we respond through MyChart? No

## 2023-07-10 NOTE — Telephone Encounter (Signed)
Contacted patient. Notified patient. Patient verbalized understanding 

## 2023-07-20 ENCOUNTER — Telehealth: Payer: Self-pay

## 2023-07-20 ENCOUNTER — Telehealth: Payer: Self-pay | Admitting: Family Medicine

## 2023-07-20 DIAGNOSIS — L819 Disorder of pigmentation, unspecified: Secondary | ICD-10-CM

## 2023-07-20 NOTE — Telephone Encounter (Signed)
 Referral was closed out. I have placed another.

## 2023-07-20 NOTE — Telephone Encounter (Signed)
 Unable to lm on vm 3/24 LS

## 2023-07-20 NOTE — Addendum Note (Signed)
 Addended by: Gabriel Earing on: 07/20/2023 11:34 AM   Modules accepted: Orders

## 2023-07-20 NOTE — Telephone Encounter (Signed)
 Copied from CRM [id number]. Topic: Referral - Request for Referral >> Jul 09, 2023  5:30 PM Eunice Blase wrote: Did the patient discuss referral with their provider in the last year? Yes (If No - schedule appointment) (If Yes - send message)  Appointment offered? Yes  Type of order/referral and detailed reason for visit: [facility]  Preference of office, provider, location: [facility]  If referral order, have you been seen by this specialty before? No (If Yes, this issue or another issue? When? Where?  Can we respond through MyChart? No >> Jul 20, 2023 10:57 AM Izetta Dakin wrote: Patient calling to check the status of referral to [facility]. Please contact patient in relation to his insurance.

## 2023-07-22 ENCOUNTER — Telehealth: Payer: Self-pay | Admitting: Family Medicine

## 2023-07-22 NOTE — Telephone Encounter (Signed)
 Pt made aware. LS

## 2023-07-22 NOTE — Telephone Encounter (Unsigned)
 Copied from CRM [id number]. Topic: Referral - Question >> Jul 20, 2023  1:16 PM Fredrica W wrote: Reason for CRM: Patient called. States he made appt for referral but he first available they had is in July. He wanted to know if there was any way to be seen any sooner. He does not know of any place he could get into sooner and does not know if insurance requires referral but wanted to call back and see due to family history of skin cancer. Thank You >> Jul 21, 2023  3:05 PM Toni Amend B wrote: I will contact Office's that accept Patient's insurance and will see if they can offer him a sooner Appt.

## 2023-08-07 DIAGNOSIS — E66813 Obesity, class 3: Secondary | ICD-10-CM | POA: Diagnosis not present

## 2023-08-07 DIAGNOSIS — F5105 Insomnia due to other mental disorder: Secondary | ICD-10-CM | POA: Diagnosis not present

## 2023-08-07 DIAGNOSIS — F50811 Binge eating disorder, moderate: Secondary | ICD-10-CM | POA: Diagnosis not present

## 2023-08-07 DIAGNOSIS — F900 Attention-deficit hyperactivity disorder, predominantly inattentive type: Secondary | ICD-10-CM | POA: Diagnosis not present

## 2023-08-07 DIAGNOSIS — F319 Bipolar disorder, unspecified: Secondary | ICD-10-CM | POA: Diagnosis not present

## 2023-08-07 DIAGNOSIS — Z6841 Body Mass Index (BMI) 40.0 and over, adult: Secondary | ICD-10-CM | POA: Diagnosis not present

## 2023-08-10 ENCOUNTER — Other Ambulatory Visit: Payer: Self-pay | Admitting: Family Medicine

## 2023-08-10 DIAGNOSIS — K219 Gastro-esophageal reflux disease without esophagitis: Secondary | ICD-10-CM

## 2023-08-19 ENCOUNTER — Ambulatory Visit: Payer: Self-pay | Admitting: Cardiology

## 2023-08-27 ENCOUNTER — Other Ambulatory Visit: Payer: Self-pay | Admitting: Family Medicine

## 2023-08-27 DIAGNOSIS — M1A09X0 Idiopathic chronic gout, multiple sites, without tophus (tophi): Secondary | ICD-10-CM

## 2023-09-02 DIAGNOSIS — M109 Gout, unspecified: Secondary | ICD-10-CM | POA: Diagnosis not present

## 2023-09-02 DIAGNOSIS — Z202 Contact with and (suspected) exposure to infections with a predominantly sexual mode of transmission: Secondary | ICD-10-CM | POA: Diagnosis not present

## 2023-09-11 DIAGNOSIS — F900 Attention-deficit hyperactivity disorder, predominantly inattentive type: Secondary | ICD-10-CM | POA: Diagnosis not present

## 2023-09-11 DIAGNOSIS — Z6841 Body Mass Index (BMI) 40.0 and over, adult: Secondary | ICD-10-CM | POA: Diagnosis not present

## 2023-09-11 DIAGNOSIS — F50811 Binge eating disorder, moderate: Secondary | ICD-10-CM | POA: Diagnosis not present

## 2023-09-11 DIAGNOSIS — F319 Bipolar disorder, unspecified: Secondary | ICD-10-CM | POA: Diagnosis not present

## 2023-09-11 DIAGNOSIS — E66813 Obesity, class 3: Secondary | ICD-10-CM | POA: Diagnosis not present

## 2023-09-11 DIAGNOSIS — F5105 Insomnia due to other mental disorder: Secondary | ICD-10-CM | POA: Diagnosis not present

## 2023-09-15 DIAGNOSIS — M1000 Idiopathic gout, unspecified site: Secondary | ICD-10-CM | POA: Diagnosis not present

## 2023-09-15 DIAGNOSIS — F411 Generalized anxiety disorder: Secondary | ICD-10-CM | POA: Diagnosis not present

## 2023-09-15 DIAGNOSIS — K219 Gastro-esophageal reflux disease without esophagitis: Secondary | ICD-10-CM | POA: Diagnosis not present

## 2023-09-15 DIAGNOSIS — G4733 Obstructive sleep apnea (adult) (pediatric): Secondary | ICD-10-CM | POA: Diagnosis not present

## 2023-09-15 DIAGNOSIS — F319 Bipolar disorder, unspecified: Secondary | ICD-10-CM | POA: Diagnosis not present

## 2023-09-15 DIAGNOSIS — I10 Essential (primary) hypertension: Secondary | ICD-10-CM | POA: Diagnosis not present

## 2023-09-24 ENCOUNTER — Ambulatory Visit (INDEPENDENT_AMBULATORY_CARE_PROVIDER_SITE_OTHER): Admitting: Family Medicine

## 2023-09-24 VITALS — BP 140/91 | HR 94 | Temp 97.4°F | Ht 73.0 in | Wt 398.6 lb

## 2023-09-24 DIAGNOSIS — R000 Tachycardia, unspecified: Secondary | ICD-10-CM

## 2023-09-24 DIAGNOSIS — I10 Essential (primary) hypertension: Secondary | ICD-10-CM

## 2023-09-24 DIAGNOSIS — F900 Attention-deficit hyperactivity disorder, predominantly inattentive type: Secondary | ICD-10-CM

## 2023-09-24 DIAGNOSIS — N5089 Other specified disorders of the male genital organs: Secondary | ICD-10-CM

## 2023-09-24 MED ORDER — ATENOLOL 25 MG PO TABS: 25.0000 mg | ORAL_TABLET | Freq: Every day | ORAL | 3 refills | Status: DC

## 2023-09-24 MED ORDER — AMLODIPINE BESY-BENAZEPRIL HCL 5-10 MG PO CAPS: 1.0000 | ORAL_CAPSULE | Freq: Every day | ORAL | 1 refills | Status: DC

## 2023-09-24 MED ORDER — NYSTATIN 100000 UNIT/GM EX CREA
1.0000 | TOPICAL_CREAM | Freq: Two times a day (BID) | CUTANEOUS | 0 refills | Status: AC
Start: 2023-09-24 — End: 2023-10-04

## 2023-09-24 NOTE — Progress Notes (Signed)
 Acute Office Visit  Subjective:     Patient ID: Howard [name], male    DOB: 1991/01/26, 33 y.o.   MRN: [id number]  Chief Complaint  Patient presents with   genital lesion    HPI Patient is in today for a white patch on the shaft of his penis for about 1 month. It is painful and itches at times, particularly after a shower. Unsure of any changes. Never been sexually active. Denies urinary symptoms, swelling, erythema, discharge. He does sweat often.   HR at home has been around 105-110. BH started him on vyvanse for ADHD. Home Bps have been a little elevated. Used to be on metoprolol for tachycardia but stopped because of possible worsening of asthma but now that he has been off of it, he doesn't believe it impacted his asthma symptoms.    ROS As per HPI.      Objective:    BP (!) 140/91   Pulse 94   Temp (!) 97.4 F (36.3 C) (Temporal)   Ht 6\' 1"  (1.854 m)   Wt (!) 398 lb 9.6 oz (180.8 kg)   SpO2 96%   BMI 52.59 kg/m  Wt Readings from Last 3 Encounters:  09/24/23 (!) 398 lb 9.6 oz (180.8 kg)  05/22/23 (!) 404 lb 3.2 oz (183.3 kg)  04/30/23 (!) 400 lb 6.4 oz (181.6 kg)      Physical Exam Vitals and nursing note reviewed. Exam conducted with a chaperone present.  Constitutional:      General: He is not in acute distress.    Appearance: He is obese. He is not ill-appearing, toxic-appearing or diaphoretic.  Cardiovascular:     Rate and Rhythm: Normal rate and regular rhythm.     Heart sounds: Normal heart sounds. No murmur heard. Pulmonary:     Effort: Pulmonary effort is normal.     Breath sounds: Normal breath sounds.  Genitourinary:    Penis: Circumcised. No erythema, tenderness, discharge or swelling.      Testes: Normal.     Epididymis:     Right: Normal.     Left: Normal.       Comments: Small, flat, white lesion to area indicated above. No drainage, exudate, or erythema.  Musculoskeletal:     Right lower leg: No edema.     Left lower leg: No edema.   Skin:    General: Skin is warm and dry.  Neurological:     General: No focal deficit present.     Mental Status: He is alert and oriented to person, place, and time.  Psychiatric:        Mood and Affect: Mood normal.        Behavior: Behavior normal.     No results found for any visits on 09/24/23.      Assessment & Plan:   Howard [name] was seen today for genital lesion.  Diagnoses and all orders for this visit:  Primary hypertension Refill provided. BP is elevated since starting vyvanse. Will add beta blocker for tachycardia. Monitor BP at home and notify for elevated readings.  -     amLODipine -benazepril  (LOTREL) 5-10 MG capsule; Take 1 capsule by mouth daily.  Tachycardia Start atenolol . Monitor HR at home. Notify for elevated or low readings.  -     atenolol  (TENORMIN ) 25 MG tablet; Take 1 tablet (25 mg total) by mouth daily.  ADHD, predominantly inattentive type On vyvanse, managed by [facility].   Morbid obesity (HCC) Weight trending down. Has appt  with bariatrics upcoming.   Genital lesion, male Never been sexually active. Try antifungal as below.Notify if no improvement or worsening of symptoms.  -     nystatin  cream (MYCOSTATIN ); Apply 1 Application topically 2 (two) times daily for 10 days.  Keep scheduled follow up appt.  The patient indicates understanding of these issues and agrees with the plan.  Albertha Huger, FNP

## 2023-09-29 ENCOUNTER — Encounter: Payer: Self-pay | Admitting: Family Medicine

## 2023-09-29 DIAGNOSIS — F900 Attention-deficit hyperactivity disorder, predominantly inattentive type: Secondary | ICD-10-CM | POA: Insufficient documentation

## 2023-10-07 ENCOUNTER — Encounter: Payer: Self-pay | Admitting: Family Medicine

## 2023-10-09 ENCOUNTER — Other Ambulatory Visit: Payer: Self-pay | Admitting: Family Medicine

## 2023-10-09 DIAGNOSIS — L439 Lichen planus, unspecified: Secondary | ICD-10-CM

## 2023-10-09 MED ORDER — CLOBETASOL PROPIONATE 0.05 % EX OINT
1.0000 | TOPICAL_OINTMENT | Freq: Every evening | CUTANEOUS | 0 refills | Status: AC
Start: 2023-10-09 — End: 2023-11-06

## 2023-10-12 DIAGNOSIS — Z6841 Body Mass Index (BMI) 40.0 and over, adult: Secondary | ICD-10-CM | POA: Diagnosis not present

## 2023-10-12 DIAGNOSIS — K2950 Unspecified chronic gastritis without bleeding: Secondary | ICD-10-CM | POA: Diagnosis not present

## 2023-10-12 DIAGNOSIS — J45909 Unspecified asthma, uncomplicated: Secondary | ICD-10-CM | POA: Diagnosis not present

## 2023-10-12 DIAGNOSIS — F319 Bipolar disorder, unspecified: Secondary | ICD-10-CM | POA: Diagnosis not present

## 2023-10-12 DIAGNOSIS — K219 Gastro-esophageal reflux disease without esophagitis: Secondary | ICD-10-CM | POA: Diagnosis not present

## 2023-10-12 DIAGNOSIS — I10 Essential (primary) hypertension: Secondary | ICD-10-CM | POA: Diagnosis not present

## 2023-10-12 DIAGNOSIS — Z79899 Other long term (current) drug therapy: Secondary | ICD-10-CM | POA: Diagnosis not present

## 2023-10-12 DIAGNOSIS — G4733 Obstructive sleep apnea (adult) (pediatric): Secondary | ICD-10-CM | POA: Diagnosis not present

## 2023-10-12 DIAGNOSIS — M109 Gout, unspecified: Secondary | ICD-10-CM | POA: Diagnosis not present

## 2023-10-13 DIAGNOSIS — K2950 Unspecified chronic gastritis without bleeding: Secondary | ICD-10-CM | POA: Diagnosis not present

## 2023-10-19 ENCOUNTER — Encounter: Payer: Self-pay | Admitting: Family Medicine

## 2023-10-19 DIAGNOSIS — E785 Hyperlipidemia, unspecified: Secondary | ICD-10-CM | POA: Diagnosis not present

## 2023-10-19 DIAGNOSIS — F411 Generalized anxiety disorder: Secondary | ICD-10-CM | POA: Diagnosis not present

## 2023-10-19 DIAGNOSIS — Z6841 Body Mass Index (BMI) 40.0 and over, adult: Secondary | ICD-10-CM | POA: Diagnosis not present

## 2023-10-19 DIAGNOSIS — Z136 Encounter for screening for cardiovascular disorders: Secondary | ICD-10-CM | POA: Diagnosis not present

## 2023-10-19 DIAGNOSIS — Z01818 Encounter for other preprocedural examination: Secondary | ICD-10-CM | POA: Diagnosis not present

## 2023-10-19 DIAGNOSIS — K922 Gastrointestinal hemorrhage, unspecified: Secondary | ICD-10-CM | POA: Diagnosis not present

## 2023-10-19 DIAGNOSIS — F319 Bipolar disorder, unspecified: Secondary | ICD-10-CM | POA: Diagnosis not present

## 2023-10-19 DIAGNOSIS — R000 Tachycardia, unspecified: Secondary | ICD-10-CM | POA: Diagnosis not present

## 2023-10-19 DIAGNOSIS — M1000 Idiopathic gout, unspecified site: Secondary | ICD-10-CM | POA: Diagnosis not present

## 2023-10-19 DIAGNOSIS — K219 Gastro-esophageal reflux disease without esophagitis: Secondary | ICD-10-CM | POA: Diagnosis not present

## 2023-10-21 DIAGNOSIS — F54 Psychological and behavioral factors associated with disorders or diseases classified elsewhere: Secondary | ICD-10-CM | POA: Diagnosis not present

## 2023-10-21 DIAGNOSIS — Z6841 Body Mass Index (BMI) 40.0 and over, adult: Secondary | ICD-10-CM | POA: Diagnosis not present

## 2023-10-21 DIAGNOSIS — Z7189 Other specified counseling: Secondary | ICD-10-CM | POA: Diagnosis not present

## 2023-10-27 ENCOUNTER — Ambulatory Visit: Admitting: Physician Assistant

## 2023-10-27 ENCOUNTER — Encounter: Payer: Self-pay | Admitting: Physician Assistant

## 2023-10-27 DIAGNOSIS — Z1283 Encounter for screening for malignant neoplasm of skin: Secondary | ICD-10-CM | POA: Diagnosis not present

## 2023-10-27 DIAGNOSIS — L814 Other melanin hyperpigmentation: Secondary | ICD-10-CM | POA: Diagnosis not present

## 2023-10-27 DIAGNOSIS — L578 Other skin changes due to chronic exposure to nonionizing radiation: Secondary | ICD-10-CM

## 2023-10-27 DIAGNOSIS — W908XXA Exposure to other nonionizing radiation, initial encounter: Secondary | ICD-10-CM | POA: Diagnosis not present

## 2023-10-27 DIAGNOSIS — Z808 Family history of malignant neoplasm of other organs or systems: Secondary | ICD-10-CM

## 2023-10-27 DIAGNOSIS — D1801 Hemangioma of skin and subcutaneous tissue: Secondary | ICD-10-CM | POA: Diagnosis not present

## 2023-10-27 DIAGNOSIS — D229 Melanocytic nevi, unspecified: Secondary | ICD-10-CM

## 2023-10-27 NOTE — Patient Instructions (Signed)

## 2023-10-27 NOTE — Progress Notes (Signed)
   New Patient Visit   Subjective  Howard [name] is a 33 y.o. male who presents for the following:  Total Body Skin Exam (TBSE)  Patient present today for new patient visit for TBSE.The patient reports he  has spots, moles and lesions to be evaluated, some may be new or changing and the patient may have concern these could be cancer. Patient has previously been treated by a dermatologist.Patient reports he  does not have hx of bx. Patient reports family history of skin cancers (paternal grandfather - melanoma) Patient reports throughout his lifetime has had minimal sun exposure. Currently, patient reports if he  has excessive sun exposure, he  does apply sunscreen and/or wears protective coverings.  The following portions of the chart were reviewed this encounter and updated as appropriate: medications, allergies, medical history  Review of Systems:  No other skin or systemic complaints except as noted in HPI or Assessment and Plan.  Objective  Well appearing patient in no apparent distress; mood and affect are within normal limits.  A full examination was performed including scalp, head, eyes, ears, nose, lips, neck, chest, axillae, abdomen, back, buttocks, bilateral upper extremities, bilateral lower extremities, hands, feet, fingers, toes, fingernails, and toenails. All findings within normal limits unless otherwise noted below.    Relevant exam findings are noted in the Assessment and Plan.    Assessment & Plan   LENTIGINES, HEMANGIOMAS - Benign normal skin lesions - Benign-appearing - Call for any changes  BENIGN MELANOCYTIC NEVI - Tan-brown and/or pink-flesh-colored symmetric macules and papules - Benign appearing on exam today - Observation - Call clinic for new or changing moles - Recommend daily use of broad spectrum spf 30+ sunscreen to sun-exposed areas.   ACTINIC DAMAGE - Chronic condition, secondary to cumulative UV/sun exposure - diffuse scaly erythematous macules  with underlying dyspigmentation - Recommend daily broad spectrum sunscreen SPF 30+ to sun-exposed areas, reapply every 2 hours as needed.  - Staying in the shade or wearing long sleeves, sun glasses (UVA+UVB protection) and wide brim hats (4-inch brim around the entire circumference of the hat) are also recommended for sun protection.  - Call for new or changing lesions.  SKIN CANCER SCREENING PERFORMED TODAY SKIN CANCER SCREENING   LENTIGINES   MULTIPLE BENIGN NEVI   ACTINIC SKIN DAMAGE    Return in about 1 year (around 10/26/2024) for TBSE.   Documentation: I have reviewed the above documentation for accuracy and completeness, and I agree with the above.   I, Shirron Maranda, CMA, am acting as Neurosurgeon for [employer], PA-C.   Drue Harr K, PA-C

## 2023-11-08 ENCOUNTER — Encounter: Payer: Self-pay | Admitting: Cardiology

## 2023-11-08 DIAGNOSIS — R002 Palpitations: Secondary | ICD-10-CM | POA: Insufficient documentation

## 2023-11-08 NOTE — Progress Notes (Deleted)
 Cardiology Office Note   Date:  11/08/2023   ID:  Howard [name], DOB November 22, 1990, MRN [id number]  PCP:  Joesph Annabella HERO, FNP  Cardiologist:   None Referring:  ***  No chief complaint on file.     History of Present Illness: Howard [name] is a 33 y.o. male who presents for evaluation of tachycardia and an abnormal EKG.      Echo in Jan demonstrated an EF of 55 - 60%.  ***     Past Medical History:  Diagnosis Date   Acute lower GI bleeding hospitalized 12/06/2014   Anxiety    Bipolar 1 disorder (HCC)    Childhood asthma    Chronic chest pain    Depression    GERD (gastroesophageal reflux disease)    Headache    a few times/month (12/06/2014)   Hypertension    Migraine    a few times/month (12/06/2014)   Obese    Pollen allergies    Shortness of breath dyspnea    with exertion    Past Surgical History:  Procedure Laterality Date   ESOPHAGOGASTRODUODENOSCOPY N/A 12/07/2014   Procedure: ESOPHAGOGASTRODUODENOSCOPY (EGD);  Surgeon: Lesta JULIANNA Fitz, MD;  Location: [facility] ENDOSCOPY;  Service: Endoscopy;  Laterality: N/A;   FRACTURE SURGERY     ORIF HUMERUS FRACTURE Right 11/30/2014   Procedure: OPEN REDUCTION INTERNAL FIXATION (ORIF) RIGHT Distal HUMERUS FRACTURE;  Surgeon: Eva Herring, MD;  Location: [facility] OR;  Service: Orthopedics;  Laterality: Right;     Current Outpatient Medications  Medication Sig Dispense Refill   albuterol  (VENTOLIN  HFA) 108 (90 Base) MCG/ACT inhaler Inhale 1-2 puffs into the lungs every 6 (six) hours as needed for wheezing or shortness of breath. 18 g 1   allopurinol  (ZYLOPRIM ) 100 MG tablet TAKE 1 TABLET BY MOUTH EVERY DAY 90 tablet 0   amLODipine -benazepril  (LOTREL) 5-10 MG capsule Take 1 capsule by mouth daily. 90 capsule 1   atenolol  (TENORMIN ) 25 MG tablet Take 1 tablet (25 mg total) by mouth daily. 90 tablet 3   citalopram (CELEXA) 10 MG tablet Take 10 mg by mouth daily.     famotidine  (PEPCID ) 40 MG tablet TAKE 1/2 TABLET (20MG ) TWICE A DAY  45 tablet 0   fexofenadine  (ALLEGRA  ALLERGY ) 180 MG tablet Take 1 tablet (180 mg total) by mouth daily. 30 tablet 5   fluticasone  (FLONASE ) 50 MCG/ACT nasal spray Place 2 sprays into both nostrils daily. 16 g 5   hydrochlorothiazide  (HYDRODIURIL ) 25 MG tablet Take 1 tablet (25 mg total) by mouth daily. 90 tablet 1   lamoTRIgine (LAMICTAL) 200 MG tablet Take by mouth.     mometasone -formoterol  (DULERA) 200-5 MCG/ACT AERO Inhale 2 puffs into the lungs 2 (two) times daily. 13 g 5   pantoprazole  (PROTONIX ) 40 MG tablet Take 1 tablet (40 mg total) by mouth daily. 90 tablet 3   traZODone (DESYREL) 100 MG tablet Take by mouth.     VYVANSE 50 MG capsule Take 50 mg by mouth daily.     No current facility-administered medications for this visit.    Allergies:   Patient has no known allergies.    Social History:  The patient  reports that he has never smoked. He has been exposed to tobacco smoke. He has never used smokeless tobacco. He reports that he does not drink alcohol and does not use drugs.   Family History:  The patient's ***family history includes Cancer in an other family member; Hypertension in his mother.  ROS:  Please see the history of present illness.   Otherwise, review of systems are positive for {NONE DEFAULTED:18576}.   All other systems are reviewed and negative.    PHYSICAL EXAM: VS:  There were no vitals taken for this visit. , BMI There is no height or weight on file to calculate BMI. GENERAL:  Well appearing HEENT:  Pupils equal round and reactive, fundi not visualized, oral mucosa unremarkable NECK:  No jugular venous distention, waveform within normal limits, carotid upstroke brisk and symmetric, no bruits, no thyromegaly LYMPHATICS:  No cervical, inguinal adenopathy LUNGS:  Clear to auscultation bilaterally BACK:  No CVA tenderness CHEST:  Unremarkable HEART:  PMI not displaced or sustained,S1 and S2 within normal limits, no S3, no S4, no clicks, no rubs, ***  murmurs ABD:  Flat, positive bowel sounds normal in frequency in pitch, no bruits, no rebound, no guarding, no midline pulsatile mass, no hepatomegaly, no splenomegaly EXT:  2 plus pulses throughout, no edema, no cyanosis no clubbing SKIN:  No rashes no nodules NEURO:  Cranial nerves II through XII grossly intact, motor grossly intact throughout PSYCH:  Cognitively intact, oriented to person place and time    EKG:        Recent Labs: 04/30/2023: ALT 28; BUN 11; Creatinine, Ser 1.08; Hemoglobin 15.1; Platelets 335; Potassium 4.5; Sodium 139; TSH 2.220    Lipid Panel    Component Value Date/Time   CHOL 166 10/23/2022 1503   TRIG 99 10/23/2022 1503   HDL 35 (L) 10/23/2022 1503   CHOLHDL 4.7 10/23/2022 1503   LDLCALC 113 (H) 10/23/2022 1503      Wt Readings from Last 3 Encounters:  09/24/23 (!) 398 lb 9.6 oz (180.8 kg)  05/22/23 (!) 404 lb 3.2 oz (183.3 kg)  04/30/23 (!) 400 lb 6.4 oz (181.6 kg)      Other studies Reviewed: Additional studies/ records that were reviewed today include: ***. Review of the above records demonstrates:  Please see elsewhere in the note.  ***   ASSESSMENT AND PLAN:  Tachycardia:  ***  Chest pain:  ***   Current medicines are reviewed at length with the patient today.  The patient {ACTIONS; HAS/DOES NOT HAVE:19233} concerns regarding medicines.  The following changes have been made:  {PLAN; NO CHANGE:13088:s}  Labs/ tests ordered today include: *** No orders of the defined types were placed in this encounter.    Disposition:   FU with ***    Signed, Lynwood Schilling, MD  11/08/2023 2:41 PM    [facility]

## 2023-11-11 ENCOUNTER — Ambulatory Visit: Admitting: Cardiology

## 2023-11-11 DIAGNOSIS — R002 Palpitations: Secondary | ICD-10-CM

## 2023-11-11 DIAGNOSIS — R000 Tachycardia, unspecified: Secondary | ICD-10-CM

## 2023-11-19 ENCOUNTER — Ambulatory Visit: Payer: Medicaid Other | Admitting: Family Medicine

## 2023-11-19 ENCOUNTER — Encounter: Payer: Self-pay | Admitting: Family Medicine

## 2023-11-19 VITALS — BP 130/85 | HR 101 | Temp 98.9°F | Ht 73.0 in | Wt 385.2 lb

## 2023-11-19 DIAGNOSIS — F900 Attention-deficit hyperactivity disorder, predominantly inattentive type: Secondary | ICD-10-CM | POA: Diagnosis not present

## 2023-11-19 DIAGNOSIS — I10 Essential (primary) hypertension: Secondary | ICD-10-CM

## 2023-11-19 DIAGNOSIS — L608 Other nail disorders: Secondary | ICD-10-CM

## 2023-11-19 DIAGNOSIS — K219 Gastro-esophageal reflux disease without esophagitis: Secondary | ICD-10-CM | POA: Diagnosis not present

## 2023-11-19 DIAGNOSIS — L439 Lichen planus, unspecified: Secondary | ICD-10-CM

## 2023-11-19 DIAGNOSIS — L659 Nonscarring hair loss, unspecified: Secondary | ICD-10-CM

## 2023-11-19 DIAGNOSIS — R000 Tachycardia, unspecified: Secondary | ICD-10-CM | POA: Diagnosis not present

## 2023-11-19 DIAGNOSIS — M1A09X0 Idiopathic chronic gout, multiple sites, without tophus (tophi): Secondary | ICD-10-CM | POA: Diagnosis not present

## 2023-11-19 MED ORDER — ALLOPURINOL 100 MG PO TABS: 100.0000 mg | ORAL_TABLET | Freq: Every day | ORAL | 3 refills | Status: AC

## 2023-11-19 MED ORDER — AMLODIPINE BESY-BENAZEPRIL HCL 5-10 MG PO CAPS: 1.0000 | ORAL_CAPSULE | Freq: Every day | ORAL | 3 refills | Status: DC

## 2023-11-19 MED ORDER — HYDROCHLOROTHIAZIDE 25 MG PO TABS: 25.0000 mg | ORAL_TABLET | Freq: Every day | ORAL | 3 refills | Status: DC

## 2023-11-19 MED ORDER — PANTOPRAZOLE SODIUM 40 MG PO TBEC: 40.0000 mg | DELAYED_RELEASE_TABLET | Freq: Every day | ORAL | 3 refills | Status: DC

## 2023-11-19 NOTE — Progress Notes (Signed)
 Established Patient Office Visit  Subjective   Patient ID: Howard [name], male    DOB: 1990-11-25  Age: 33 y.o. MRN: [id number]  Chief Complaint  Patient presents with   Medical Management of Chronic Issues    HPI  HTN Complaint with meds - Yes Current Medications - amlodipine -benazepril , hydrodiuril  25 mg Checking BP at home - 120-130/80s Pertinent ROS:  Visual Disturbances - No Chest pain - No Dyspnea - No Palpitations - No LE edema - Yes, baseline. Has not been wearing compression socks.  He has not yet seen cardiology. Will have to reschedule.   2. Gout Well controlled with allopurinol .   3. GERD Compliant with medications - Yes Current medications - protonix  40 Voice change - No Hemoptysis - No Heartburn- yes Dysphagia  - No Water brash - Yes Red Flags (weight loss, hematochezia, melena, weight loss, early satiety, fevers, odynophagia, or persistent vomiting) - No  4. Anxiety/depression/ADHD Stable. Continues follow up with BH.   5. Obesity He has been working with bariatric team for weight loss. He has been eating low calorie, high protein diet. He has been exercising. He is expecting to have bariatric surgery in the next 1-2 months.   6. Hair loss Noticed increased hair fall for the last month. He is waking up with hair on his pillow. He has noticed lines and cracks in his nails. Also notes that nails haven't seemed to grow.    7. Genital lesion Started to clear up with steroid cream but then had to discontinue this per bariatric team due to the steroid component.      11/19/2023    1:07 PM 09/24/2023   11:18 AM 05/22/2023    1:52 PM  Depression screen PHQ 2/9  Decreased Interest 1 1 1   Down, Depressed, Hopeless 0 2 2  PHQ - 2 Score 1 3 3   Altered sleeping 1 2 1   Tired, decreased energy 2 2 0  Change in appetite 0 1 2  Feeling bad or failure about yourself  1 1 0  Trouble concentrating 1 1 1   Moving slowly or fidgety/restless 2 2 2   Suicidal  thoughts 0 0 0  PHQ-9 Score 8 12 9   Difficult doing work/chores Somewhat difficult Very difficult Somewhat difficult      11/19/2023    1:07 PM 09/24/2023   11:19 AM 10/23/2022    1:59 PM  GAD 7 : Generalized Anxiety Score  Nervous, Anxious, on Edge 1 2 1   Control/stop worrying 2 1 2   Worry too much - different things 2 1 1   Trouble relaxing 2 2 1   Restless 2 1 1   Easily annoyed or irritable 2 2 2   Afraid - awful might happen 3 1 1   Total GAD 7 Score 14 10 9   Anxiety Difficulty Very difficult Very difficult Somewhat difficult      Past Medical History:  Diagnosis Date   Acute lower GI bleeding hospitalized 12/06/2014   Anxiety    Bipolar 1 disorder (HCC)    Childhood asthma    Depression    GERD (gastroesophageal reflux disease)    Hypertension    Migraine    a few times/month (12/06/2014)   Obese    Pollen allergies       ROS As per HPI.    Objective:     BP 130/85 Comment: at home reading per pt  Pulse (!) 101   Temp 98.9 F (37.2 C) (Temporal)   Ht 6' 1 (1.854 m)  Wt (!) 385 lb 3.2 oz (174.7 kg)   SpO2 99%   BMI 50.82 kg/m  Wt Readings from Last 3 Encounters:  11/19/23 (!) 385 lb 3.2 oz (174.7 kg)  09/24/23 (!) 398 lb 9.6 oz (180.8 kg)  05/22/23 (!) 404 lb 3.2 oz (183.3 kg)      Physical Exam Vitals and nursing note reviewed.  Constitutional:      General: He is not in acute distress.    Appearance: He is not ill-appearing, toxic-appearing or diaphoretic.  HENT:     Head: Hair is normal.  Cardiovascular:     Rate and Rhythm: Normal rate and regular rhythm.     Heart sounds: Normal heart sounds. No murmur heard. Pulmonary:     Effort: Pulmonary effort is normal. No respiratory distress.     Breath sounds: No wheezing, rhonchi or rales.  Abdominal:     General: Bowel sounds are normal. There is no distension.     Palpations: Abdomen is soft.     Tenderness: There is no abdominal tenderness. There is no right CVA tenderness, guarding or  rebound.  Musculoskeletal:     Right lower leg: No edema.     Left lower leg: No edema.  Skin:    General: Skin is warm and dry.     Comments: No significant abnormalities to nails. Nails are short.   Neurological:     General: No focal deficit present.     Mental Status: He is alert and oriented to person, place, and time.  Psychiatric:        Mood and Affect: Mood normal.        Behavior: Behavior normal.      No results found for any visits on 11/19/23.    The ASCVD Risk score (Arnett DK, et al., 2019) failed to calculate for the following reasons:   The 2019 ASCVD risk score is only valid for ages 39 to 53    Assessment & Plan:   Howard [name] was seen today for medical management of chronic issues.  Diagnoses and all orders for this visit:  Primary hypertension Well controlled on current regimen.  -     amLODipine -benazepril  (LOTREL) 5-10 MG capsule; Take 1 capsule by mouth daily. -     hydrochlorothiazide  (HYDRODIURIL ) 25 MG tablet; Take 1 tablet (25 mg total) by mouth daily.  Morbid obesity (HCC) Weight trending down. Working with bariatric team.  -     CBC with Differential/Platelet  Gastroesophageal reflux disease, unspecified whether esophagitis present Well controlled on current regimen.  -     pantoprazole  (PROTONIX ) 40 MG tablet; Take 1 tablet (40 mg total) by mouth daily.  Falling hair Change in nail appearance Will check labs as below. ? Changes in diet.  -     Thyroid  Panel With TSH -     Anti-TPO Ab (RDL)  Tachycardia Stable. Continue atenolol . Reschedule cardiology appt.   ADHD, predominantly inattentive type Continue follow up with [facility] for management. Monitor BP at home.   Lichen planus of penis Restart steroid cream when able. Discussed referral if symptoms persist or worsen.   Chronic gout of multiple sites, unspecified cause Well controlled on current regimen.  -     allopurinol  (ZYLOPRIM ) 100 MG tablet; Take 1 tablet (100 mg total) by mouth  daily.   Return in about 6 months (around 05/21/2024) for CPE with fasting labs.   The patient indicates understanding of these issues and agrees with the plan.  Tiona Ruane M  Joesph, FNP

## 2023-11-23 ENCOUNTER — Ambulatory Visit: Payer: Self-pay | Admitting: Family Medicine

## 2023-11-23 LAB — CBC WITH DIFFERENTIAL/PLATELET
Basophils Absolute: 0.1 x10E3/uL (ref 0.0–0.2)
Basos: 1 %
EOS (ABSOLUTE): 0.2 x10E3/uL (ref 0.0–0.4)
Eos: 2 %
Hematocrit: 47.4 % (ref 37.5–51.0)
Hemoglobin: 14.8 g/dL (ref 13.0–17.7)
Immature Grans (Abs): 0 x10E3/uL (ref 0.0–0.1)
Immature Granulocytes: 0 %
Lymphocytes Absolute: 2 x10E3/uL (ref 0.7–3.1)
Lymphs: 22 %
MCH: 26.7 pg (ref 26.6–33.0)
MCHC: 31.2 g/dL — ABNORMAL LOW (ref 31.5–35.7)
MCV: 85 fL (ref 79–97)
Monocytes Absolute: 0.8 x10E3/uL (ref 0.1–0.9)
Monocytes: 8 %
Neutrophils Absolute: 6.1 x10E3/uL (ref 1.4–7.0)
Neutrophils: 67 %
Platelets: 335 x10E3/uL (ref 150–450)
RBC: 5.55 x10E6/uL (ref 4.14–5.80)
RDW: 13.5 % (ref 11.6–15.4)
WBC: 9 x10E3/uL (ref 3.4–10.8)

## 2023-11-23 LAB — THYROID PANEL WITH TSH
Free Thyroxine Index: 2.5 (ref 1.2–4.9)
T3 Uptake Ratio: 26 % (ref 24–39)
T4, Total: 9.5 ug/dL (ref 4.5–12.0)
TSH: 2.16 u[IU]/mL (ref 0.450–4.500)

## 2023-11-23 LAB — ANTI-TPO AB (RDL): Anti-TPO Ab (RDL): <9 [IU]/mL (ref <9.0)

## 2023-12-03 DIAGNOSIS — Z6841 Body Mass Index (BMI) 40.0 and over, adult: Secondary | ICD-10-CM | POA: Diagnosis not present

## 2023-12-03 DIAGNOSIS — Z01818 Encounter for other preprocedural examination: Secondary | ICD-10-CM | POA: Diagnosis not present

## 2023-12-08 DIAGNOSIS — G4733 Obstructive sleep apnea (adult) (pediatric): Secondary | ICD-10-CM | POA: Diagnosis not present

## 2023-12-08 DIAGNOSIS — E785 Hyperlipidemia, unspecified: Secondary | ICD-10-CM | POA: Diagnosis not present

## 2023-12-08 DIAGNOSIS — I10 Essential (primary) hypertension: Secondary | ICD-10-CM | POA: Diagnosis not present

## 2023-12-13 ENCOUNTER — Encounter: Payer: Self-pay | Admitting: Family Medicine

## 2023-12-13 DIAGNOSIS — N5089 Other specified disorders of the male genital organs: Secondary | ICD-10-CM

## 2023-12-14 NOTE — Addendum Note (Signed)
 Addended by: JOESPH ANNABELLA HERO on: 12/14/2023 01:54 PM   Modules accepted: Orders

## 2023-12-23 DIAGNOSIS — K2960 Other gastritis without bleeding: Secondary | ICD-10-CM | POA: Diagnosis not present

## 2023-12-23 DIAGNOSIS — K219 Gastro-esophageal reflux disease without esophagitis: Secondary | ICD-10-CM | POA: Diagnosis not present

## 2023-12-23 DIAGNOSIS — Z6841 Body Mass Index (BMI) 40.0 and over, adult: Secondary | ICD-10-CM | POA: Diagnosis not present

## 2023-12-23 DIAGNOSIS — I10 Essential (primary) hypertension: Secondary | ICD-10-CM | POA: Diagnosis not present

## 2023-12-23 DIAGNOSIS — G4733 Obstructive sleep apnea (adult) (pediatric): Secondary | ICD-10-CM | POA: Diagnosis not present

## 2023-12-23 DIAGNOSIS — E785 Hyperlipidemia, unspecified: Secondary | ICD-10-CM | POA: Diagnosis not present

## 2023-12-23 DIAGNOSIS — Z681 Body mass index (BMI) 19 or less, adult: Secondary | ICD-10-CM | POA: Diagnosis not present

## 2023-12-25 DIAGNOSIS — F319 Bipolar disorder, unspecified: Secondary | ICD-10-CM | POA: Diagnosis not present

## 2023-12-25 DIAGNOSIS — Z79899 Other long term (current) drug therapy: Secondary | ICD-10-CM | POA: Diagnosis not present

## 2023-12-25 DIAGNOSIS — R143 Flatulence: Secondary | ICD-10-CM | POA: Diagnosis not present

## 2023-12-25 DIAGNOSIS — M791 Myalgia, unspecified site: Secondary | ICD-10-CM | POA: Diagnosis not present

## 2023-12-25 DIAGNOSIS — K219 Gastro-esophageal reflux disease without esophagitis: Secondary | ICD-10-CM | POA: Diagnosis not present

## 2023-12-25 DIAGNOSIS — F4310 Post-traumatic stress disorder, unspecified: Secondary | ICD-10-CM | POA: Diagnosis not present

## 2023-12-25 DIAGNOSIS — K2950 Unspecified chronic gastritis without bleeding: Secondary | ICD-10-CM | POA: Diagnosis not present

## 2023-12-25 DIAGNOSIS — F419 Anxiety disorder, unspecified: Secondary | ICD-10-CM | POA: Diagnosis not present

## 2023-12-25 DIAGNOSIS — R531 Weakness: Secondary | ICD-10-CM | POA: Diagnosis not present

## 2023-12-25 DIAGNOSIS — I10 Essential (primary) hypertension: Secondary | ICD-10-CM | POA: Diagnosis not present

## 2023-12-30 ENCOUNTER — Ambulatory Visit: Payer: Self-pay

## 2023-12-30 NOTE — Telephone Encounter (Signed)
 FYI Only or Action Required?: FYI only for provider.  Patient was last seen in primary care on 11/19/2023 by Joesph Annabella HERO, FNP.  Called Nurse Triage reporting Hypertension.  Symptoms began several days ago.  Interventions attempted: Prescription medications: Atenolol .  Symptoms are: unchanged.  Triage Disposition: Go to ED Now (Notify PCP)  Patient/caregiver understands and will follow disposition?: No, refuses disposition    Reason for Disposition  [1] Systolic BP >= 160 OR Diastolic >= 100 AND [2] cardiac (e.g., breathing difficulty, chest pain) or neurologic symptoms (e.g., new-onset blurred or double vision, unsteady gait)  Answer Assessment - Initial Assessment Questions Bariatric surgery on 8/26. HR up to 150 just from walking. 105 at rest. Surgeon stated they did not want patient on BP medications at all, maybe a year down the line. Only want him taking beta-blockers. Took atenolol  today and HR dropped into the 40's. Advised patient to ED for symptoms, patient refusing due to interference from bariatric team when he went to ED on 8/29. Advised patient to get in touch with bariatric team office as they should have after hours answering service. Patient wondering if he can cut Atenolol  in half to take if that would help. Please advise.    1. BLOOD PRESSURE: What is your blood pressure? Did you take at least two measurements 5 minutes apart?     153/104   2. ONSET: When did you take your blood pressure?     2pm today   3. HOW: How did you take your blood pressure? (e.g., automatic home BP monitor, visiting nurse)     Surgeon office during f/u. Staples removed today  4. HISTORY: Do you have a history of high blood pressure?     Yes   5. MEDICINES: Are you taking any medicines for blood pressure? Have you missed any doses recently?     Yes, not on them due to bariatric surgery  6. OTHER SYMPTOMS: Do you have any symptoms? (e.g., blurred vision, chest  pain, difficulty breathing, headache, weakness)     Blurred vission, weakness walking  Protocols used: Blood Pressure - High-A-AH   Message from [place] T sent at 12/30/2023  5:12 PM EDT  Patient had recent bariatric procedure and was taken off medications for blood pressure but heart is staying around 105 at rest bp in hundreds both numbers- when he tried taking his atenenlol his heart rate went into the 40's, just went outside and got dizzy- [phone]

## 2023-12-31 NOTE — Telephone Encounter (Signed)
 Patients BP today is 150/102 HR- 95.  States he was taken off his bp medication on 8/27 due to his bariatric surgery.  States he seen his bariatric team yesterday and his bp was high and they stated to contact PCP.   Spoke with Tiffany and she advised patient cut his Atenol pill in half and schedule appointment with her on Monday.  Patient aware and voiced understanding.  Appointment scheduled. Also advised patient to keep a BP log and bring it to his appointment Monday.

## 2024-01-01 ENCOUNTER — Ambulatory Visit

## 2024-01-04 ENCOUNTER — Ambulatory Visit (INDEPENDENT_AMBULATORY_CARE_PROVIDER_SITE_OTHER)

## 2024-01-04 ENCOUNTER — Ambulatory Visit (HOSPITAL_COMMUNITY)
Admission: RE | Admit: 2024-01-04 | Discharge: 2024-01-04 | Disposition: A | Discharge status: Home / Self Care | Source: Ambulatory Visit | Attending: Family Medicine | Admitting: Family Medicine

## 2024-01-04 ENCOUNTER — Ambulatory Visit: Admitting: Family Medicine

## 2024-01-04 ENCOUNTER — Encounter: Payer: Self-pay | Admitting: Family Medicine

## 2024-01-04 ENCOUNTER — Ambulatory Visit: Payer: Self-pay | Admitting: Family Medicine

## 2024-01-04 VITALS — BP 126/87 | HR 89 | Temp 97.8°F | Ht 73.0 in | Wt 348.8 lb

## 2024-01-04 DIAGNOSIS — K838 Other specified diseases of biliary tract: Secondary | ICD-10-CM | POA: Diagnosis not present

## 2024-01-04 DIAGNOSIS — Z903 Acquired absence of stomach [part of]: Secondary | ICD-10-CM

## 2024-01-04 DIAGNOSIS — N202 Calculus of kidney with calculus of ureter: Secondary | ICD-10-CM | POA: Diagnosis not present

## 2024-01-04 DIAGNOSIS — R109 Unspecified abdominal pain: Secondary | ICD-10-CM | POA: Diagnosis not present

## 2024-01-04 DIAGNOSIS — R1031 Right lower quadrant pain: Secondary | ICD-10-CM | POA: Diagnosis not present

## 2024-01-04 DIAGNOSIS — F339 Major depressive disorder, recurrent, unspecified: Secondary | ICD-10-CM

## 2024-01-04 DIAGNOSIS — R110 Nausea: Secondary | ICD-10-CM

## 2024-01-04 DIAGNOSIS — I10 Essential (primary) hypertension: Secondary | ICD-10-CM | POA: Diagnosis not present

## 2024-01-04 DIAGNOSIS — R932 Abnormal findings on diagnostic imaging of liver and biliary tract: Secondary | ICD-10-CM | POA: Diagnosis not present

## 2024-01-04 DIAGNOSIS — N200 Calculus of kidney: Secondary | ICD-10-CM

## 2024-01-04 DIAGNOSIS — R310 Gross hematuria: Secondary | ICD-10-CM

## 2024-01-04 DIAGNOSIS — N2889 Other specified disorders of kidney and ureter: Secondary | ICD-10-CM | POA: Diagnosis not present

## 2024-01-04 LAB — CBC WITH DIFFERENTIAL/PLATELET
Basophils Absolute: 0 x10E3/uL (ref 0.0–0.2)
Basos: 0 %
EOS (ABSOLUTE): 0.1 x10E3/uL (ref 0.0–0.4)
Eos: 1 %
Hematocrit: 48.1 % (ref 37.5–51.0)
Hemoglobin: 15.7 g/dL (ref 13.0–17.7)
Lymphocytes Absolute: 1 x10E3/uL (ref 0.7–3.1)
Lymphs: 11 %
MCH: 27.1 pg (ref 26.6–33.0)
MCHC: 32.6 g/dL (ref 31.5–35.7)
MCV: 83 fL (ref 79–97)
Monocytes Absolute: 1 x10E3/uL — ABNORMAL HIGH (ref 0.1–0.9)
Monocytes: 11 %
Neutrophils Absolute: 7.1 x10E3/uL — ABNORMAL HIGH (ref 1.4–7.0)
Neutrophils: 77 %
Platelets: 223 x10E3/uL (ref 150–450)
RBC: 5.79 x10E6/uL (ref 4.14–5.80)
RDW: 15.9 % — ABNORMAL HIGH (ref 11.6–15.4)
WBC: 9.2 x10E3/uL (ref 3.4–10.8)

## 2024-01-04 LAB — MICROSCOPIC EXAMINATION
Bacteria, UA: NONE SEEN
Epithelial Cells (non renal): NONE SEEN /HPF (ref 0–10)
RBC, Urine: >30 /HPF — ABNORMAL HIGH (ref 0–2)
WBC, UA: NONE SEEN /HPF (ref 0–5)

## 2024-01-04 LAB — URINALYSIS, ROUTINE W REFLEX MICROSCOPIC
Bilirubin, UA: POSITIVE — AB
Glucose, UA: NEGATIVE
Leukocytes,UA: NEGATIVE
Nitrite, UA: NEGATIVE
Specific Gravity, UA: 1.025 (ref 1.005–1.030)
Urobilinogen, Ur: 0.2 mg/dL (ref 0.2–1.0)
pH, UA: 6 (ref 5.0–7.5)

## 2024-01-04 NOTE — Progress Notes (Signed)
 Acute Office Visit  Subjective:     Patient ID: Howard [name], male    DOB: 1990/05/03, 33 y.o.   MRN: [id number]  Chief Complaint  Patient presents with   Abdominal Pain   Nausea    HPI  History of Present Illness   Howard [name] is a 33 year old male who presents with right-sided abdominal pain following gastric sleeve surgery.  Abdominal pain/flank - Right-sided flank pain that radiates to right lower abdomen began suddenly this morning - Pain is constant, with intermittent episodes described as 'someone just punched me in the side' - Pain intensity rated 6/10 at rest, increasing to 8/10 with intermittently - No urinary symptoms, fever, or increased gas - No diarrhea; bowel movements are normal - No heavy lifting since surgery, only walking - 11 s/p gastric sleep  Gastrointestinal symptoms - Nausea and dry heaving without vomiting - No oral intake except clear liquids for last 3 weeks per bariatric team - Last liquid intake was around 11 PM the previous night  Medication changes and blood pressure management - Discontinued Vyvanse and Dulera inhaler due to elevated blood pressure, per mental health provider recommendation - Currently taking half a tablet of Timolol; full tablet causes bradycardia  Psychological and emotional adjustment - Difficulty adjusting to life post-surgery, including managing blood pressure and medication changes - Experiencing stress and emotional challenges related to postoperative recovery - Appointment scheduled for tomorrow to address depression          11/19/2023    1:07 PM 09/24/2023   11:18 AM 05/22/2023    1:52 PM  Depression screen PHQ 2/9  Decreased Interest 1 1 1   Down, Depressed, Hopeless 0 2 2  PHQ - 2 Score 1 3 3   Altered sleeping 1 2 1   Tired, decreased energy 2 2 0  Change in appetite 0 1 2  Feeling bad or failure about yourself  1 1 0  Trouble concentrating 1 1 1   Moving slowly or fidgety/restless 2 2 2   Suicidal  thoughts 0 0 0  PHQ-9 Score 8 12 9   Difficult doing work/chores Somewhat difficult Very difficult Somewhat difficult      11/19/2023    1:07 PM 09/24/2023   11:19 AM 10/23/2022    1:59 PM  GAD 7 : Generalized Anxiety Score  Nervous, Anxious, on Edge 1 2 1   Control/stop worrying 2 1 2   Worry too much - different things 2 1 1   Trouble relaxing 2 2 1   Restless 2 1 1   Easily annoyed or irritable 2 2 2   Afraid - awful might happen 3 1 1   Total GAD 7 Score 14 10 9   Anxiety Difficulty Very difficult Very difficult Somewhat difficult     ROS      Objective:    BP 126/87   Pulse 89   Temp 97.8 F (36.6 C) (Temporal)   Ht 6' 1 (1.854 m)   Wt (!) 348 lb 12.8 oz (158.2 kg)   SpO2 99%   BMI 46.02 kg/m    Physical Exam Vitals and nursing note reviewed.  Constitutional:      General: He is not in acute distress.    Appearance: He is obese. He is not ill-appearing, toxic-appearing or diaphoretic.  HENT:     Head: Normocephalic and atraumatic.     Mouth/Throat:     Mouth: Mucous membranes are moist.     Pharynx: Oropharynx is clear.  Eyes:     General: No scleral icterus.  Extraocular Movements: Extraocular movements intact.  Cardiovascular:     Rate and Rhythm: Regular rhythm.     Heart sounds: Normal heart sounds. No murmur heard. Pulmonary:     Effort: Pulmonary effort is normal. No respiratory distress.     Breath sounds: Normal breath sounds. No wheezing.  Abdominal:     General: A surgical scar is present. Bowel sounds are decreased. There is no distension or abdominal bruit.     Palpations: Abdomen is soft.     Tenderness: There is no abdominal tenderness. There is no right CVA tenderness or left CVA tenderness.  Skin:    General: Skin is warm and dry.     Coloration: Skin is not jaundiced.  Neurological:     Mental Status: He is alert and oriented to person, place, and time.  Psychiatric:        Mood and Affect: Mood is anxious.        Behavior: Behavior  normal.     Urine dipstick shows positive for RBC's, positive for protein, and positive for ketones.  Micro exam: >30 RBC's per HPF.  [facility] radiology report: 1. 0.6 x 0.3 cm calcific density lateral to the right L2 transverse process could potentially be in the right renal collecting system or right UPJ region.     Assessment & Plan:   Nikola was seen today for abdominal pain and nausea.  Diagnoses and all orders for this visit:  Right flank pain -     Urinalysis, Routine w reflex microscopic -     Cancel: DG Abd 1 View; Future -     DG Abd 1 View; Future -     CBC with Differential/Platelet -     CMP14+EGFR -     Cancel: CT RENAL STONE STUDY; Future -     CT RENAL STONE STUDY; Future  Nausea  S/P gastric sleeve procedure  Right lower quadrant abdominal pain  Gross hematuria  Primary hypertension  Morbid obesity (HCC)  Depression, recurrent (HCC)  Concern for renal stone 1. 0.6 x 0.3 cm calcific density lateral to the right L2 transverse process could potentially be in the right renal collecting system or right UPJ region per [facility] report with right flank pain and hematuria. STAT CT ordered for further evaluation. PO fluids provided in office. STAT CBC and CMP ordered. BP at goal today. Keep appt with [facility] for depression management. Will notify patient of CT report and plan of care pending report and labs.    Annabella CHRISTELLA Search, FNP

## 2024-01-05 DIAGNOSIS — F50811 Binge eating disorder, moderate: Secondary | ICD-10-CM | POA: Diagnosis not present

## 2024-01-05 DIAGNOSIS — F900 Attention-deficit hyperactivity disorder, predominantly inattentive type: Secondary | ICD-10-CM | POA: Diagnosis not present

## 2024-01-05 DIAGNOSIS — F319 Bipolar disorder, unspecified: Secondary | ICD-10-CM | POA: Diagnosis not present

## 2024-01-06 LAB — CBC WITH DIFFERENTIAL/PLATELET

## 2024-01-06 LAB — CMP14+EGFR
ALT: 247 IU/L — ABNORMAL HIGH (ref 0–44)
AST: 79 IU/L — ABNORMAL HIGH (ref 0–40)
Albumin: 4.4 g/dL (ref 4.3–5.2)
Alkaline Phosphatase: 166 IU/L — ABNORMAL HIGH (ref 44–121)
BUN/Creatinine Ratio: 6 — ABNORMAL LOW (ref 9–20)
BUN: 6 mg/dL (ref 6–20)
Bilirubin Total: 0.7 mg/dL (ref 0.0–1.2)
CO2: 15 mmol/L — ABNORMAL LOW (ref 20–29)
Calcium: 10 mg/dL (ref 8.7–10.2)
Chloride: 103 mmol/L (ref 96–106)
Creatinine, Ser: 1.06 mg/dL (ref 0.76–1.27)
Globulin, Total: 2.9 g/dL (ref 1.5–4.5)
Glucose: 68 mg/dL — ABNORMAL LOW (ref 70–99)
Potassium: 4.4 mmol/L (ref 3.5–5.2)
Sodium: 140 mmol/L (ref 134–144)
Total Protein: 7.3 g/dL (ref 6.0–8.5)
eGFR: 97 mL/min/1.73 (ref >59)

## 2024-01-08 ENCOUNTER — Encounter: Payer: Self-pay | Admitting: Family Medicine

## 2024-01-08 MED ORDER — TAMSULOSIN HCL 0.4 MG PO CAPS: 0.4000 mg | ORAL_CAPSULE | Freq: Every day | ORAL | 3 refills | Status: DC

## 2024-01-11 DIAGNOSIS — N132 Hydronephrosis with renal and ureteral calculous obstruction: Secondary | ICD-10-CM | POA: Diagnosis not present

## 2024-01-11 DIAGNOSIS — N200 Calculus of kidney: Secondary | ICD-10-CM | POA: Diagnosis not present

## 2024-01-12 DIAGNOSIS — N132 Hydronephrosis with renal and ureteral calculous obstruction: Secondary | ICD-10-CM | POA: Diagnosis not present

## 2024-01-13 ENCOUNTER — Ambulatory Visit: Admitting: Urology

## 2024-01-21 ENCOUNTER — Ambulatory Visit: Admitting: Family

## 2024-01-22 ENCOUNTER — Encounter: Payer: Self-pay | Admitting: Family Medicine

## 2024-01-22 ENCOUNTER — Ambulatory Visit

## 2024-01-22 VITALS — BP 127/83 | HR 78 | Temp 97.8°F | Ht 73.0 in | Wt 342.0 lb

## 2024-01-22 DIAGNOSIS — Z23 Encounter for immunization: Secondary | ICD-10-CM | POA: Diagnosis not present

## 2024-01-22 DIAGNOSIS — M1A09X0 Idiopathic chronic gout, multiple sites, without tophus (tophi): Secondary | ICD-10-CM

## 2024-01-22 NOTE — Patient Instructions (Signed)
 Ask bariatric team if there are any preferred treatment for gout flare. Typical treatments include anti-inflammatories (ibuprofen, indomethacin), prednisone, or colchicine.

## 2024-01-22 NOTE — Progress Notes (Addendum)
 Acute Office Visit  Subjective:     Patient ID: Howard [name], male    DOB: 13-Mar-1991, 33 y.o.   MRN: [id number]  Chief Complaint  Patient presents with   Gout    HPI  History of Present Illness   Howard [name] is a 33 year old male with gout who presents with a gout flare-up in his left foot.  Gout flare - Acute onset of significant pain in the left foot, localized to the midfoot region - Pain began after consumption of lean red meat on Tuesday; awoke the following day with symptoms - Pain worsens with ambulation and weight-bearing - Tenderness to palpation of the affected area - Pain partially relieved by elevation and application of ice - Pain exacerbated by activity  History of gout and flare triggers - Prior diagnosis of gout with previous flare-ups - No recent flare-ups until current episode, attributed to dietary modifications including cessation of sugary drinks - Red meat identified as a known trigger for gout flares; recent consumption preceded current episode - Post-bariatric surgery, increased sensitivity to dietary triggers  Medication use and access - Bariatric team had him stop allopurinol  until at least 1 month after surgery. He isn't sure what he can take at this point because of recent gastric bypass.  - Previously prescribed colchicine for gout management, unable to refill due to uncertainty about prescribing provider - Has used ibuprofen and indomethacin in the past for symptom relief       ROS As per HPI.      Objective:    BP 127/83   Pulse 78   Temp 97.8 F (36.6 C) (Temporal)   Ht 6' 1 (1.854 m)   Wt (!) 342 lb (155.1 kg)   SpO2 99%   BMI 45.12 kg/m  Wt Readings from Last 3 Encounters:  01/22/24 (!) 342 lb (155.1 kg)  01/04/24 (!) 348 lb 12.8 oz (158.2 kg)  11/19/23 (!) 385 lb 3.2 oz (174.7 kg)      Physical Exam Vitals and nursing note reviewed.  Constitutional:      General: He is not in acute distress.    Appearance: He is  obese. He is not ill-appearing, toxic-appearing or diaphoretic.  Pulmonary:     Effort: Pulmonary effort is normal. No respiratory distress.  Musculoskeletal:     Left foot: Swelling (mild swelling with tenderness to left midfoot. No eyrtema.) present.  Skin:    General: Skin is warm and dry.  Neurological:     General: No focal deficit present.     Mental Status: He is alert and oriented to person, place, and time.  Psychiatric:        Mood and Affect: Mood normal.        Behavior: Behavior normal.     No results found for any visits on 01/22/24.      Assessment & Plan:   Howard [name] was seen today for gout.  Diagnoses and all orders for this visit:  Chronic gout of multiple sites, unspecified cause      Chronic gout flare, left foot Acute gout flare likely triggered by red meat consumption post-bariatric surgery. Colchicine and anti-inflammatories contraindicated post-surgery.  Prednisone likely not preferred by bariatric team. Awaiting bariatric team input for safe medication options. - Contact bariatric team for safe medication options, including anti-inflammatories, prednisone, or colchicine. - Consider allopurinol  post-flare as advised by bariatric team. - Advise Tylenol  for pain management, does not address swelling. - Recommend avoiding weight-bearing activities.  Flu vaccine today in the office.   Return to office for new or worsening symptoms, or if symptoms persist.   The patient indicates understanding of these issues and agrees with the plan.  Howard CHRISTELLA Search, FNP

## 2024-01-22 NOTE — Addendum Note (Signed)
 Addended by: JOESPH ANNABELLA HERO on: 01/22/2024 09:34 AM   Modules accepted: Level of Service

## 2024-01-25 ENCOUNTER — Encounter: Payer: Self-pay | Admitting: Family Medicine

## 2024-01-25 MED ORDER — CELECOXIB 100 MG PO CAPS: 100.0000 mg | ORAL_CAPSULE | Freq: Two times a day (BID) | ORAL | 0 refills | Status: DC

## 2024-01-30 ENCOUNTER — Encounter: Payer: Self-pay | Admitting: Family Medicine

## 2024-02-04 ENCOUNTER — Encounter: Payer: Self-pay | Admitting: Family Medicine

## 2024-02-04 MED ORDER — COLCHICINE 0.6 MG PO TABS: 0.6000 mg | ORAL_TABLET | Freq: Every day | ORAL | 1 refills | Status: DC

## 2024-02-22 ENCOUNTER — Other Ambulatory Visit: Payer: Self-pay | Admitting: Family Medicine

## 2024-02-25 ENCOUNTER — Telehealth: Payer: Self-pay | Admitting: Family Medicine

## 2024-02-25 NOTE — Telephone Encounter (Signed)
 Noted

## 2024-02-25 NOTE — Telephone Encounter (Signed)
 Copied from CRM #[id number]. Topic: General - Other >> Feb 25, 2024  8:51 AM Joesph NOVAK wrote: Reason for CRM: Auston from [facility] is calling to notify patients provider that the health assesment can be reviewed on the providers portal.

## 2024-02-26 ENCOUNTER — Other Ambulatory Visit: Payer: Self-pay | Admitting: Family Medicine

## 2024-02-28 ENCOUNTER — Encounter: Payer: Self-pay | Admitting: Cardiology

## 2024-02-28 NOTE — Progress Notes (Unsigned)
  Cardiology Office Note:   Date:  02/28/2024  ID:  Howard [name], DOB [birth year], MRN [id number] PCP: Joesph Annabella HERO, FNP  [facility] Providers Cardiologist:  None {  History of Present Illness:   Howard [name] is a 33 y.o. male ***  Chest pain, abnormal EKG, tachycardia.  ***   ROS: ***  Studies Reviewed:    EKG:       ***  Risk Assessment/Calculations:   {Does this patient have ATRIAL FIBRILLATION?:[phone]} No BP recorded.  {Refresh Note OR Click here to enter BP  :1}***        Physical Exam:   VS:  There were no vitals taken for this visit.   Wt Readings from Last 3 Encounters:  01/22/24 (!) 342 lb (155.1 kg)  01/04/24 (!) 348 lb 12.8 oz (158.2 kg)  11/19/23 (!) 385 lb 3.2 oz (174.7 kg)     GEN: Well nourished, well developed in no acute distress NECK: No JVD; No carotid bruits CARDIAC: ***RR, *** murmurs, rubs, gallops RESPIRATORY:  Clear to auscultation without rales, wheezing or rhonchi  ABDOMEN: Soft, non-tender, non-distended EXTREMITIES:  No edema; No deformity   ASSESSMENT AND PLAN:   ***    Precordial chest pain:  ***  Tachycardia:  ***    Follow up ***  Signed, Lynwood Schilling, MD

## 2024-03-01 ENCOUNTER — Telehealth: Payer: Self-pay

## 2024-03-01 NOTE — Telephone Encounter (Signed)
 Spoke with pt regarding his appointment tomorrow. Pt stated he is working on getting proof of insurance but plans to come to his appointment although he did not believe he needed to be seen. Pt made aware that we have a 24 hour cancellation policy. Pt verbalized understanding. All questions if any were answered.

## 2024-03-01 NOTE — Telephone Encounter (Signed)
-----   Message from Lynwood Schilling sent at 02/29/2024  8:59 PM EST ----- Hi,  This is another patient where we need to confirm.  2 cancellations for new patient.  Thanks.

## 2024-03-02 ENCOUNTER — Encounter: Payer: Self-pay | Admitting: Cardiology

## 2024-03-02 ENCOUNTER — Ambulatory Visit (INDEPENDENT_AMBULATORY_CARE_PROVIDER_SITE_OTHER): Admitting: Cardiology

## 2024-03-02 VITALS — BP 118/96 | HR 87 | Ht 72.0 in | Wt 320.0 lb

## 2024-03-02 DIAGNOSIS — I10 Essential (primary) hypertension: Secondary | ICD-10-CM | POA: Diagnosis not present

## 2024-03-02 DIAGNOSIS — E785 Hyperlipidemia, unspecified: Secondary | ICD-10-CM | POA: Diagnosis not present

## 2024-03-02 DIAGNOSIS — R072 Precordial pain: Secondary | ICD-10-CM | POA: Diagnosis not present

## 2024-03-02 NOTE — Patient Instructions (Addendum)

## 2024-03-11 ENCOUNTER — Encounter (HOSPITAL_COMMUNITY): Payer: Self-pay | Admitting: Emergency Medicine

## 2024-03-11 ENCOUNTER — Emergency Department (HOSPITAL_COMMUNITY)
Admission: EM | Admit: 2024-03-11 | Discharge: 2024-03-12 | Disposition: A | Discharge status: Home / Self Care | Attending: Emergency Medicine | Admitting: Emergency Medicine

## 2024-03-11 ENCOUNTER — Emergency Department (HOSPITAL_COMMUNITY)

## 2024-03-11 ENCOUNTER — Other Ambulatory Visit: Payer: Self-pay

## 2024-03-11 DIAGNOSIS — R10A1 Flank pain, right side: Secondary | ICD-10-CM | POA: Diagnosis present

## 2024-03-11 DIAGNOSIS — N132 Hydronephrosis with renal and ureteral calculous obstruction: Secondary | ICD-10-CM | POA: Diagnosis not present

## 2024-03-11 DIAGNOSIS — N201 Calculus of ureter: Secondary | ICD-10-CM

## 2024-03-11 LAB — CBC WITH DIFFERENTIAL/PLATELET
Abs Immature Granulocytes: 0.01 K/uL (ref 0.00–0.07)
Basophils Absolute: 0.1 K/uL (ref 0.0–0.1)
Basophils Relative: 1 %
Eosinophils Absolute: 0.1 K/uL (ref 0.0–0.5)
Eosinophils Relative: 2 %
HCT: 40.6 % (ref 39.0–52.0)
Hemoglobin: 13.1 g/dL (ref 13.0–17.0)
Immature Granulocytes: 0 %
Lymphocytes Relative: 18 %
Lymphs Abs: 1.3 K/uL (ref 0.7–4.0)
MCH: 28.3 pg (ref 26.0–34.0)
MCHC: 32.3 g/dL (ref 30.0–36.0)
MCV: 87.7 fL (ref 80.0–100.0)
Monocytes Absolute: 0.7 K/uL (ref 0.1–1.0)
Monocytes Relative: 10 %
Neutro Abs: 5 K/uL (ref 1.7–7.7)
Neutrophils Relative %: 69 %
Platelets: 195 K/uL (ref 150–400)
RBC: 4.63 MIL/uL (ref 4.22–5.81)
RDW: 17.2 % — ABNORMAL HIGH (ref 11.5–15.5)
WBC: 7.3 K/uL (ref 4.0–10.5)
nRBC: 0 % (ref 0.0–0.2)

## 2024-03-11 LAB — BASIC METABOLIC PANEL WITH GFR
Anion gap: 10 (ref 5–15)
BUN: 8 mg/dL (ref 6–20)
CO2: 24 mmol/L (ref 22–32)
Calcium: 8.8 mg/dL — ABNORMAL LOW (ref 8.9–10.3)
Chloride: 106 mmol/L (ref 98–111)
Creatinine, Ser: 0.93 mg/dL (ref 0.61–1.24)
GFR, Estimated: >60 mL/min (ref >60)
Glucose, Bld: 98 mg/dL (ref 70–99)
Potassium: 3.6 mmol/L (ref 3.5–5.1)
Sodium: 140 mmol/L (ref 135–145)

## 2024-03-11 LAB — URINALYSIS, ROUTINE W REFLEX MICROSCOPIC
Bacteria, UA: NONE SEEN
Bilirubin Urine: NEGATIVE
Glucose, UA: NEGATIVE mg/dL
Ketones, ur: NEGATIVE mg/dL
Leukocytes,Ua: NEGATIVE
Nitrite: NEGATIVE
Protein, ur: 100 mg/dL — AB
RBC / HPF: >50 RBC/hpf (ref 0–5)
Specific Gravity, Urine: 1.016 (ref 1.005–1.030)
pH: 6 (ref 5.0–8.0)

## 2024-03-11 MED ORDER — HYDROMORPHONE HCL 1 MG/ML IJ SOLN
1.0000 mg | Freq: Once | INTRAMUSCULAR | Status: AC
  Administered 2024-03-11: 1 mg via INTRAVENOUS
  Filled 2024-03-11: qty 1

## 2024-03-11 NOTE — ED Notes (Signed)
 Straight cathed with an 18 fr. Cath due to the pt states he was urinating blood clots earlier. A larger cath was required if there are blood clots to pass. Pt tolerated well. 2 ED techs assisted at this time.

## 2024-03-11 NOTE — ED Triage Notes (Addendum)
 Pt arrived to ED via POV c/o right flank pain that started about 5 hrs ago. Pt had urinary stent removed this morning in [facility].   Pt states he has not been able to urinate normally since having procedure completed this morning. States he when he last attempted to urinate he had a large blood clot come out.

## 2024-03-11 NOTE — Progress Notes (Signed)
 Procedure  Preoperative Diagnosis/Chief Complaint: kidney stone Postoperative Diagnosis: Same Procedure Performed: Cystoscopy with  stent removal Anesthesia: Local. 2% lidocaine  gel 5cc per urethra  Specimen: None  Indications: Howard [name] is a 33 y.o. male initially seen for kidney stone. The patient was offered a cystoscopy for removal of retained stent. H&P updated: I have assessed the patient and found no significant changes since the last office note. Patient is still a candidate for the below procedure and wishes to proceed. Procedure: After an explanation of the risks and benefits of the procedure to the patient, informed consent was obtained. Time out was taken and all elements were addressed with procedure and patient identity confirmed. The patient was placed supine on the table. The patient's lower abdomen, pelvis, perineum, and genitalia were prepped and draped in the usual sterile surgical fashion. Xylocaine  jelly was instilled through the urethral meatus for local anesthesia. After allowing adequate time for anesthetic effect, a flexible cystoscope was navigated through the urethral meatus and into the patient's bladder with the following findings:   Urethra: normal , ,  Trigone:  ureteral orifices with clear efflux  Bladder:  no tumors,   trabeculation,  stones Other:  none  Stent was grasped with flexible graspers and removed in its entirety. The cystoscope was then removed. The patient tolerated the procedure well. *Chaperone present during exam and procedure Disposition: The patient will be released home today. We administered cephALEXin and lidocaine  HCl.,, Post op instructions given to patient Plan -rus 8 weeks

## 2024-03-11 NOTE — Telephone Encounter (Signed)
 Pt. Calling stating that he had a stent removal 03-11-24. States he is having right sided flank pain and difficulty urinating. States he urinated some blood clots earlier. ED disposition given. Reason for Disposition . [1] Unable to urinate (or only a few drops) > 4 hours AND [2] bladder feels very full (e.g., palpable bladder or strong urge to urinate)  Protocols used: Flank Pain-A-AH

## 2024-03-12 ENCOUNTER — Other Ambulatory Visit: Payer: Self-pay

## 2024-03-12 ENCOUNTER — Encounter (HOSPITAL_COMMUNITY): Payer: Self-pay | Admitting: *Deleted

## 2024-03-12 ENCOUNTER — Emergency Department (HOSPITAL_COMMUNITY)
Admission: EM | Admit: 2024-03-12 | Discharge: 2024-03-13 | Disposition: A | Discharge status: Home / Self Care | Source: Home / Self Care | Attending: Emergency Medicine | Admitting: Emergency Medicine

## 2024-03-12 ENCOUNTER — Emergency Department (HOSPITAL_COMMUNITY)

## 2024-03-12 DIAGNOSIS — N202 Calculus of kidney with calculus of ureter: Secondary | ICD-10-CM | POA: Insufficient documentation

## 2024-03-12 DIAGNOSIS — I10 Essential (primary) hypertension: Secondary | ICD-10-CM | POA: Insufficient documentation

## 2024-03-12 DIAGNOSIS — Z79899 Other long term (current) drug therapy: Secondary | ICD-10-CM | POA: Insufficient documentation

## 2024-03-12 DIAGNOSIS — N132 Hydronephrosis with renal and ureteral calculous obstruction: Secondary | ICD-10-CM | POA: Diagnosis not present

## 2024-03-12 DIAGNOSIS — N200 Calculus of kidney: Secondary | ICD-10-CM

## 2024-03-12 LAB — URINALYSIS, MICROSCOPIC (REFLEX)

## 2024-03-12 LAB — CBC WITH DIFFERENTIAL/PLATELET
Abs Immature Granulocytes: 0.03 K/uL (ref 0.00–0.07)
Basophils Absolute: 0 K/uL (ref 0.0–0.1)
Basophils Relative: 1 %
Eosinophils Absolute: 0.1 K/uL (ref 0.0–0.5)
Eosinophils Relative: 2 %
HCT: 43.2 % (ref 39.0–52.0)
Hemoglobin: 13.8 g/dL (ref 13.0–17.0)
Immature Granulocytes: 0 %
Lymphocytes Relative: 15 %
Lymphs Abs: 1.3 K/uL (ref 0.7–4.0)
MCH: 28.1 pg (ref 26.0–34.0)
MCHC: 31.9 g/dL (ref 30.0–36.0)
MCV: 88 fL (ref 80.0–100.0)
Monocytes Absolute: 0.9 K/uL (ref 0.1–1.0)
Monocytes Relative: 10 %
Neutro Abs: 6.2 K/uL (ref 1.7–7.7)
Neutrophils Relative %: 72 %
Platelets: 203 K/uL (ref 150–400)
RBC: 4.91 MIL/uL (ref 4.22–5.81)
RDW: 17.3 % — ABNORMAL HIGH (ref 11.5–15.5)
WBC: 8.6 K/uL (ref 4.0–10.5)
nRBC: 0 % (ref 0.0–0.2)

## 2024-03-12 LAB — COMPREHENSIVE METABOLIC PANEL WITH GFR
ALT: 27 U/L (ref 0–44)
AST: 19 U/L (ref 15–41)
Albumin: 3.5 g/dL (ref 3.5–5.0)
Alkaline Phosphatase: 77 U/L (ref 38–126)
Anion gap: 10 (ref 5–15)
BUN: 9 mg/dL (ref 6–20)
CO2: 22 mmol/L (ref 22–32)
Calcium: 9.1 mg/dL (ref 8.9–10.3)
Chloride: 110 mmol/L (ref 98–111)
Creatinine, Ser: 1.09 mg/dL (ref 0.61–1.24)
GFR, Estimated: >60 mL/min (ref >60)
Glucose, Bld: 89 mg/dL (ref 70–99)
Potassium: 3.8 mmol/L (ref 3.5–5.1)
Sodium: 142 mmol/L (ref 135–145)
Total Bilirubin: 1 mg/dL (ref 0.0–1.2)
Total Protein: 6.8 g/dL (ref 6.5–8.1)

## 2024-03-12 LAB — I-STAT CHEM 8, ED
BUN: 10 mg/dL (ref 6–20)
Calcium, Ion: 1.18 mmol/L (ref 1.15–1.40)
Chloride: 108 mmol/L (ref 98–111)
Creatinine, Ser: 1.2 mg/dL (ref 0.61–1.24)
Glucose, Bld: 85 mg/dL (ref 70–99)
HCT: 42 % (ref 39.0–52.0)
Hemoglobin: 14.3 g/dL (ref 13.0–17.0)
Potassium: 3.7 mmol/L (ref 3.5–5.1)
Sodium: 141 mmol/L (ref 135–145)
TCO2: 23 mmol/L (ref 22–32)

## 2024-03-12 LAB — URINALYSIS, ROUTINE W REFLEX MICROSCOPIC
Bilirubin Urine: NEGATIVE
Glucose, UA: NEGATIVE mg/dL
Ketones, ur: NEGATIVE mg/dL
Nitrite: NEGATIVE
Protein, ur: NEGATIVE mg/dL
Specific Gravity, Urine: 1.01 (ref 1.005–1.030)
pH: 6 (ref 5.0–8.0)

## 2024-03-12 MED ORDER — HYDROCODONE-ACETAMINOPHEN 5-325MG PREPACK (~~LOC~~: ORAL_TABLET | ORAL | 0 refills | Status: DC

## 2024-03-12 MED ORDER — KETOROLAC TROMETHAMINE 30 MG/ML IJ SOLN
30.0000 mg | Freq: Once | INTRAMUSCULAR | Status: AC
  Administered 2024-03-12: 30 mg via INTRAVENOUS
  Filled 2024-03-12: qty 1

## 2024-03-12 MED ORDER — OXYCODONE-ACETAMINOPHEN 5-325 MG PO TABS: 1.0000 | ORAL_TABLET | ORAL | 0 refills | Status: DC | PRN

## 2024-03-12 MED ORDER — HYDROMORPHONE HCL 1 MG/ML IJ SOLN
0.5000 mg | Freq: Once | INTRAMUSCULAR | Status: AC
  Administered 2024-03-12: 1 mg via INTRAVENOUS
  Filled 2024-03-12: qty 1

## 2024-03-12 MED ORDER — ONDANSETRON HCL 4 MG/2ML IJ SOLN
4.0000 mg | Freq: Once | INTRAMUSCULAR | Status: AC
  Administered 2024-03-12: 4 mg via INTRAVENOUS
  Filled 2024-03-12: qty 2

## 2024-03-12 MED ORDER — KETOROLAC TROMETHAMINE 15 MG/ML IJ SOLN
15.0000 mg | Freq: Once | INTRAMUSCULAR | Status: AC
  Administered 2024-03-13: 15 mg via INTRAVENOUS
  Filled 2024-03-12: qty 1

## 2024-03-12 MED ORDER — IOHEXOL 350 MG/ML SOLN
75.0000 mL | Freq: Once | INTRAVENOUS | Status: AC | PRN
  Administered 2024-03-12: 75 mL via INTRAVENOUS

## 2024-03-12 NOTE — ED Provider Notes (Signed)
 [place] EMERGENCY DEPARTMENT AT [facility] Provider Note   CSN: [id number] Arrival date & time: 03/12/24  1511     Patient presents with: Flank Pain   Howard [name] is a 33 y.o. male.  {Add pertinent medical, surgical, social history, OB history to HPI:32947} 33 y/o male with hx of HTN, GERD, bipolar d/o, prior gastric sleeve presents to the ED for R flank pain. Hx of kidney stone diagnosed in September. S/p cystoscopy and stent placement by [facility] on 01/11/24. Had stent removed yesterday after which time he presented to the ED at [facility] for worsening R flank pain. CT imaging noted, previously proximal, R ureteral stone now at the UPJ. Has been taking Tylenol , Perocet for pain without relief. Patient notes associated hematuria. Denies fever, chills, vomiting, trouble voiding.  The history is provided by the patient. No language interpreter was used.  Flank Pain       Prior to Admission medications   Medication Sig Start Date End Date Taking? Authorizing Provider  albuterol  (VENTOLIN  HFA) 108 (90 Base) MCG/ACT inhaler Inhale 1-2 puffs into the lungs every 6 (six) hours as needed for wheezing or shortness of breath. 02/09/23   Tobie Arleta SQUIBB, MD  allopurinol  (ZYLOPRIM ) 100 MG tablet Take 1 tablet (100 mg total) by mouth daily. 11/19/23   Joesph Annabella HERO, FNP  fluticasone  (FLONASE ) 50 MCG/ACT nasal spray Place 2 sprays into both nostrils daily. Patient not taking: Reported on 03/02/2024 02/09/23   Tobie Arleta SQUIBB, MD  HYDROcodone -acetaminophen  (VICODIN) 5-325 mg TABS tablet One tablet every 4 hours as needed 03/12/24   Sofia, Leslie K, PA-C  lamoTRIgine (LAMICTAL) 200 MG tablet Take by mouth. 03/18/22 09/23/24  [provider]  mometasone -formoterol  (DULERA) 200-5 MCG/ACT AERO Inhale 2 puffs into the lungs 2 (two) times daily. 02/09/23   Tobie Arleta SQUIBB, MD  omeprazole  (PRILOSEC) 40 MG capsule Take 40 mg by mouth daily. 12/08/23   [provider]   oxybutynin (DITROPAN-XL) 5 MG 24 hr tablet Take 5 mg by mouth. Patient not taking: Reported on 03/02/2024 01/11/24   [provider]  oxyCODONE -acetaminophen  (PERCOCET) 5-325 MG tablet Take 1 tablet by mouth every 4 (four) hours as needed for severe pain (pain score 7-10). 03/12/24 03/12/25  Sofia, Leslie K, PA-C  phenazopyridine (PYRIDIUM) 200 MG tablet Take 200 mg by mouth 3 (three) times daily as needed. Patient not taking: Reported on 03/02/2024 01/11/24   [provider]  tamsulosin  (FLOMAX ) 0.4 MG CAPS capsule Take 1 capsule (0.4 mg total) by mouth daily. Patient not taking: Reported on 03/02/2024 01/08/24   Joesph Annabella HERO, FNP  ursodiol (ACTIGALL) 300 MG capsule Take 300 mg by mouth. 12/08/23 06/05/24  [provider]    Allergies: Patient has no known allergies.    Review of Systems  Genitourinary:  Positive for flank pain.  Ten systems reviewed and are negative for acute change, except as noted in the HPI.   Updated Vital Signs BP 131/87 (BP Location: Left Arm)   Pulse 72   Temp 98.1 F (36.7 C)   Resp 18   Ht 6' (1.829 m)   Wt (!) 145.2 kg   SpO2 100%   BMI 43.41 kg/m   Physical Exam Vitals and nursing note reviewed.  Constitutional:      General: He is not in acute distress.    Appearance: He is well-developed. He is not diaphoretic.     Comments: Nontoxic appearing, obese male. Appears uncomfortable.  HENT:  Head: Normocephalic and atraumatic.  Eyes:     General: No scleral icterus.    Conjunctiva/sclera: Conjunctivae normal.  Cardiovascular:     Rate and Rhythm: Normal rate and regular rhythm.     Pulses: Normal pulses.  Pulmonary:     Effort: Pulmonary effort is normal. No respiratory distress.     Breath sounds: No stridor. No wheezing.     Comments: Respirations even and unlabored.  Lungs clear to auscultation bilaterally. Musculoskeletal:        General: Normal range of motion.     Cervical back: Normal range of motion.   Skin:    General: Skin is warm and dry.     Coloration: Skin is not pale.     Findings: No erythema or rash.  Neurological:     Mental Status: He is alert and oriented to person, place, and time.  Psychiatric:        Behavior: Behavior normal.     (all labs ordered are listed, but only abnormal results are displayed) Labs Reviewed  CBC WITH DIFFERENTIAL/PLATELET - Abnormal; Notable for the following components:      Result Value   RDW 17.3 (*)    All other components within normal limits  URINALYSIS, ROUTINE W REFLEX MICROSCOPIC - Abnormal; Notable for the following components:   Hgb urine dipstick MODERATE (*)    Leukocytes,Ua TRACE (*)    All other components within normal limits  URINALYSIS, MICROSCOPIC (REFLEX) - Abnormal; Notable for the following components:   Bacteria, UA FEW (*)    All other components within normal limits  COMPREHENSIVE METABOLIC PANEL WITH GFR  I-STAT CHEM 8, ED    EKG: None  Radiology: CT ABDOMEN PELVIS W CONTRAST Result Date: 03/12/2024 CLINICAL DATA:  Abdominal/flank pain, stone suspected. Recent stent removal. EXAM: CT ABDOMEN AND PELVIS WITH CONTRAST TECHNIQUE: Multidetector CT imaging of the abdomen and pelvis was performed using the standard protocol following bolus administration of intravenous contrast. RADIATION DOSE REDUCTION: This exam was performed according to the departmental dose-optimization program which includes automated exposure control, adjustment of the mA and/or kV according to patient size and/or use of iterative reconstruction technique. CONTRAST:  75mL OMNIPAQUE IOHEXOL 350 MG/ML SOLN COMPARISON:  03/11/2024. FINDINGS: Lower chest: Atelectasis is present at the lung bases. There is a stable 4 mm nodule in the left lower lobe, axial image 15. Hepatobiliary: No focal liver abnormality is seen. No gallstones, gallbladder wall thickening, or biliary dilatation. Pancreas: Unremarkable. No pancreatic ductal dilatation or surrounding  inflammatory changes. Spleen: Normal in size without focal abnormality. Adrenals/Urinary Tract: The vague hypoenhancement is noted in the upper poles of the kidneys bilaterally. A renal calculus is present on the left. There is prominence of the right renal collecting system with urothelial enhancement and associated fat stranding. There is a 3 x 5 mm calculus in the distal right ureter in the pelvis. No obstructive uropathy on the left. Diffuse bladder wall thickening is present. Stomach/Bowel: Gastric sleeve surgical changes are present at the stomach. No bowel obstruction, free air, or pneumatosis is seen. Appendix appears normal. Vascular/Lymphatic: No significant vascular findings are present. No enlarged abdominal or pelvic lymph nodes. Reproductive: Prostate is unremarkable. Other: No abdominopelvic ascites. Musculoskeletal: No acute osseous abnormality. IMPRESSION: 1. Mild patchy hypoenhancement in the upper poles of the kidneys bilaterally in urothelial enhancement and associated fat stranding on the right, suggesting pyelonephritis/ureteritis. 2. Mild obstructive uropathy on the right with a 3 x 5 mm calculus in the distal right ureter.  3. Nonobstructive left renal calculus. 4. Diffuse bladder wall thickening suggesting infectious or inflammatory cystitis. Electronically Signed   By: Leita Birmingham M.D.   On: 03/12/2024 18:35   CT Renal Stone Study Result Date: 03/11/2024 EXAM: CT ABDOMEN AND PELVIS WITHOUT CONTRAST 03/11/2024 11:04:15 PM TECHNIQUE: CT of the abdomen and pelvis was performed without the administration of intravenous contrast. Multiplanar reformatted images are provided for review. Automated exposure control, iterative reconstruction, and/or weight-based adjustment of the mA/kV was utilized to reduce the radiation dose to as low as reasonably achievable. COMPARISON: 01/04/2024 CLINICAL HISTORY: Abdominal/flank pain, stone suspected. FINDINGS: LOWER CHEST: Stable 4 mm noncalcified  pulmonary nodule within the visualized left lower lobe. According to the Fleischner Society pulmonary nodule recommendations, the finding is consistent with a single solid nodule <6 mm in a visualized lobe, and the recommendation is: no routine follow-up required for low-risk patients; optional CT at 12 months for high-risk patients (particularly with suspicious nodule morphology and/or upper lobe location). LIVER: The liver is unremarkable. GALLBLADDER AND BILE DUCTS: Gallbladder is unremarkable. No biliary ductal dilatation. SPLEEN: No acute abnormality. PANCREAS: No acute abnormality. ADRENAL GLANDS: No acute abnormality. KIDNEYS, URETERS AND BLADDER: Previously noted 5 x 6 mm calculus is unchanged in position within the right ureteropelvic junction, however, there has developed mild-to-moderate right hydronephrosis and moderate perinephric stranding in keeping with developing obstructive uropathy. Perinephric inflammatory stranding may relate to superimposed infection. Correlation with urinalysis and urine culture may be helpful for further management. Punctate bilateral nonobstructing 1 - 2 mm renal calculi are again identified. No hydronephrosis on the left. No additional ureteral calculi. The bladder is decompressed. GI AND BOWEL: Surgical changes of gastric sleeve resection are again identified. The stomach, small bowel, and large bowel are otherwise unremarkable. Appendix normal. There is no bowel obstruction. PERITONEUM AND RETROPERITONEUM: No ascites. No free air. VASCULATURE: Aorta is normal in caliber. LYMPH NODES: No lymphadenopathy. REPRODUCTIVE ORGANS: No acute abnormality. BONES AND SOFT TISSUES: No acute osseous abnormality. No focal soft tissue abnormality. IMPRESSION: 1. Unchanged 5 x 6 mm calculus at the right ureteropelvic junction with developing obstructive uropathy, including progressive mild-to-moderate right hydronephrosis and moderate perinephric stranding; progressive perinephric  stranding may be inflammatory in nature, as can be seen with superimposed infection and correlation with urinalysis and urine culture may be helpful for further management 2. Stable 4 mm solid noncalcified pulmonary nodule in the left lower lobe; per Fleischner Society Guidelines for a single solid nodule <6 mm, no routine follow-up required for low-risk patients; optional non-contrast chest CT at 12 months for high-risk patients Electronically signed by: Dorethia Molt MD 03/11/2024 11:13 PM EST RP Workstation: [id number]    {Document cardiac monitor, telemetry assessment procedure when appropriate:32947} Procedures   Medications Ordered in the ED  ketorolac  (TORADOL ) 15 MG/ML injection 15 mg (has no administration in time range)  HYDROmorphone  (DILAUDID ) injection 0.5 mg (has no administration in time range)  ondansetron  (ZOFRAN ) injection 4 mg (has no administration in time range)  iohexol (OMNIPAQUE) 350 MG/ML injection 75 mL (75 mLs Intravenous Contrast Given 03/12/24 1822)      {Click here for ABCD2, HEART and other calculators REFRESH Note before signing:1}                              Medical Decision Making Risk Prescription drug management.   ***  {Document critical care time when appropriate  Document review of labs and clinical decision tools ie CHADS2VASC2,  etc  Document your independent review of radiology images and any outside records  Document your discussion with family members, caretakers and with consultants  Document social determinants of health affecting pt's care  Document your decision making why or why not admission, treatments were needed:32947:::1}   Final diagnoses:  None    ED Discharge Orders     None

## 2024-03-12 NOTE — ED Provider Notes (Signed)
 [place] EMERGENCY DEPARTMENT AT [facility] Provider Note   CSN: [id number] Arrival date & time: 03/11/24  2108     Patient presents with: Post-op Problem   Howard [name] is a 33 y.o. male.   Patient complains of right sided flank pain.  Patient reports that he was diagnosed with a kidney stone in September.  He was seen by urology today who removed a stent.  Patient states since having the stent removed he has had increasing pain in his right flank.  Patient states he feels like he cannot urinate.  Patient states this pain is similar to the pain that he had when he was first diagnosed with a kidney stone.  Patient denies any fever or chills he has not had any nausea or vomiting.  Patient states that he had the stent removed because the kidney stone had passed.  Patient has a past medical history of gastric surgery states he cannot take NSAIDs.  The history is provided by the patient. No language interpreter was used.       Prior to Admission medications   Medication Sig Start Date End Date Taking? Authorizing Provider  HYDROcodone -acetaminophen  (VICODIN) 5-325 mg TABS tablet One tablet every 4 hours as needed 03/12/24  Yes Necha Harries K, PA-C  oxyCODONE -acetaminophen  (PERCOCET) 5-325 MG tablet Take 1 tablet by mouth every 4 (four) hours as needed for severe pain (pain score 7-10). 03/12/24 03/12/25 Yes Flint Sonny POUR, PA-C  albuterol  (VENTOLIN  HFA) 108 (90 Base) MCG/ACT inhaler Inhale 1-2 puffs into the lungs every 6 (six) hours as needed for wheezing or shortness of breath. 02/09/23   Tobie Arleta SQUIBB, MD  allopurinol  (ZYLOPRIM ) 100 MG tablet Take 1 tablet (100 mg total) by mouth daily. 11/19/23   Joesph Annabella HERO, FNP  fluticasone  (FLONASE ) 50 MCG/ACT nasal spray Place 2 sprays into both nostrils daily. Patient not taking: Reported on 03/02/2024 02/09/23   Tobie Arleta SQUIBB, MD  lamoTRIgine (LAMICTAL) 200 MG tablet Take by mouth. 03/18/22 09/23/24  [provider]   mometasone -formoterol  (DULERA) 200-5 MCG/ACT AERO Inhale 2 puffs into the lungs 2 (two) times daily. 02/09/23   Tobie Arleta SQUIBB, MD  omeprazole  (PRILOSEC) 40 MG capsule Take 40 mg by mouth daily. 12/08/23   [provider]  oxybutynin (DITROPAN-XL) 5 MG 24 hr tablet Take 5 mg by mouth. Patient not taking: Reported on 03/02/2024 01/11/24   [provider]  phenazopyridine (PYRIDIUM) 200 MG tablet Take 200 mg by mouth 3 (three) times daily as needed. Patient not taking: Reported on 03/02/2024 01/11/24   [provider]  tamsulosin  (FLOMAX ) 0.4 MG CAPS capsule Take 1 capsule (0.4 mg total) by mouth daily. Patient not taking: Reported on 03/02/2024 01/08/24   Joesph Annabella HERO, FNP  ursodiol (ACTIGALL) 300 MG capsule Take 300 mg by mouth. 12/08/23 06/05/24  [provider]    Allergies: Patient has no known allergies.    Review of Systems  Gastrointestinal:  Positive for abdominal pain.  Musculoskeletal:  Positive for back pain.  All other systems reviewed and are negative.   Updated Vital Signs BP (!) 154/99   Pulse 88   Temp 98.4 F (36.9 C)   Resp 18   Ht 6' (1.829 m)   Wt (!) 145.2 kg   SpO2 100%   BMI 43.40 kg/m   Physical Exam Vitals and nursing note reviewed.  Constitutional:      Appearance: He is well-developed.  HENT:     Head: Normocephalic.  Mouth/Throat:     Mouth: Mucous membranes are moist.  Eyes:     Pupils: Pupils are equal, round, and reactive to light.  Cardiovascular:     Rate and Rhythm: Normal rate.  Pulmonary:     Effort: Pulmonary effort is normal.  Abdominal:     General: There is no distension.     Palpations: Abdomen is soft.     Tenderness: There is right CVA tenderness.  Musculoskeletal:        General: Normal range of motion.     Cervical back: Normal range of motion.  Skin:    General: Skin is warm.  Neurological:     General: No focal deficit present.     Mental Status: He is alert and oriented to  person, place, and time.     (all labs ordered are listed, but only abnormal results are displayed) Labs Reviewed  CBC WITH DIFFERENTIAL/PLATELET - Abnormal; Notable for the following components:      Result Value   RDW 17.2 (*)    All other components within normal limits  BASIC METABOLIC PANEL WITH GFR - Abnormal; Notable for the following components:   Calcium 8.8 (*)    All other components within normal limits  URINALYSIS, ROUTINE W REFLEX MICROSCOPIC - Abnormal; Notable for the following components:   APPearance HAZY (*)    Hgb urine dipstick LARGE (*)    Protein, ur 100 (*)    All other components within normal limits    EKG: None  Radiology: CT Renal Stone Study Result Date: 03/11/2024 EXAM: CT ABDOMEN AND PELVIS WITHOUT CONTRAST 03/11/2024 11:04:15 PM TECHNIQUE: CT of the abdomen and pelvis was performed without the administration of intravenous contrast. Multiplanar reformatted images are provided for review. Automated exposure control, iterative reconstruction, and/or weight-based adjustment of the mA/kV was utilized to reduce the radiation dose to as low as reasonably achievable. COMPARISON: 01/04/2024 CLINICAL HISTORY: Abdominal/flank pain, stone suspected. FINDINGS: LOWER CHEST: Stable 4 mm noncalcified pulmonary nodule within the visualized left lower lobe. According to the Fleischner Society pulmonary nodule recommendations, the finding is consistent with a single solid nodule <6 mm in a visualized lobe, and the recommendation is: no routine follow-up required for low-risk patients; optional CT at 12 months for high-risk patients (particularly with suspicious nodule morphology and/or upper lobe location). LIVER: The liver is unremarkable. GALLBLADDER AND BILE DUCTS: Gallbladder is unremarkable. No biliary ductal dilatation. SPLEEN: No acute abnormality. PANCREAS: No acute abnormality. ADRENAL GLANDS: No acute abnormality. KIDNEYS, URETERS AND BLADDER: Previously noted 5 x 6  mm calculus is unchanged in position within the right ureteropelvic junction, however, there has developed mild-to-moderate right hydronephrosis and moderate perinephric stranding in keeping with developing obstructive uropathy. Perinephric inflammatory stranding may relate to superimposed infection. Correlation with urinalysis and urine culture may be helpful for further management. Punctate bilateral nonobstructing 1 - 2 mm renal calculi are again identified. No hydronephrosis on the left. No additional ureteral calculi. The bladder is decompressed. GI AND BOWEL: Surgical changes of gastric sleeve resection are again identified. The stomach, small bowel, and large bowel are otherwise unremarkable. Appendix normal. There is no bowel obstruction. PERITONEUM AND RETROPERITONEUM: No ascites. No free air. VASCULATURE: Aorta is normal in caliber. LYMPH NODES: No lymphadenopathy. REPRODUCTIVE ORGANS: No acute abnormality. BONES AND SOFT TISSUES: No acute osseous abnormality. No focal soft tissue abnormality. IMPRESSION: 1. Unchanged 5 x 6 mm calculus at the right ureteropelvic junction with developing obstructive uropathy, including progressive mild-to-moderate right hydronephrosis and moderate  perinephric stranding; progressive perinephric stranding may be inflammatory in nature, as can be seen with superimposed infection and correlation with urinalysis and urine culture may be helpful for further management 2. Stable 4 mm solid noncalcified pulmonary nodule in the left lower lobe; per Fleischner Society Guidelines for a single solid nodule <6 mm, no routine follow-up required for low-risk patients; optional non-contrast chest CT at 12 months for high-risk patients Electronically signed by: Dorethia Molt MD 03/11/2024 11:13 PM EST RP Workstation: [id number]     Procedures   Medications Ordered in the ED  HYDROmorphone  (DILAUDID ) injection 1 mg (1 mg Intravenous Given 03/11/24 2315)  ketorolac  (TORADOL ) 30 MG/ML  injection 30 mg (30 mg Intravenous Given 03/12/24 0101)                                    Medical Decision Making Patient complains of right flank pain.  He reports this is similar to the pain that he had when he had a stone.  He had a stent removed by his urologist today  Amount and/or Complexity of Data Reviewed External Data Reviewed: notes.    Details: Primary care notes reviewed.  Patient had a 6 mm UPJ stone diagnosed on September 8.  He was seen by [facility] urology and had a stent placed. Labs: ordered. Decision-making details documented in ED Course.    Details: Labs ordered reviewed and interpreted.  UA shows large hemoglobin. Radiology: ordered and independent interpretation performed. Decision-making details documented in ED Course.    Details: CT scan shows a 6 mm UPJ stone.  Risk Prescription drug management. Risk Details: Patient is given Dilaudid  for pain and reports pain relief.  Patient tells me that he has passed the 6 mm stone and that this was why the stent was removed.  Radiology reports that the stone is in the same location as previous stone.  When I reviewed patient's CT scan the current stone is in the same location.  I suspect that this is the same stone.  Patient is given Toradol  IV.  He is feeling much better.  Patient is advised to schedule follow-up with his urologist.  Patient is given a prepack of hydrocodone  to take home for pain he is given a prescription for pain medicine.  He is advised to resume his Flomax .   Patient had a bladder scan done by RN which showed 30 cc of urine.  Patient had a Foley placed prior to my evaluation.  I do not think patient's discomfort is from a bladder outlet obstruction.  I asked the nursing staff to remove the Foley.     Final diagnoses:  Obstruction of right ureteropelvic junction (UPJ) due to stone    ED Discharge Orders          Ordered    HYDROcodone -acetaminophen  (VICODIN) 5-325 mg TABS tablet        03/12/24  0040    oxyCODONE -acetaminophen  (PERCOCET) 5-325 MG tablet  Every 4 hours PRN        03/12/24 0041            An After Visit Summary was printed and given to the patient.    Flint Sonny POUR, PA-C 03/12/24 1630    Francesca Elsie CROME, MD 03/19/24 [phone]

## 2024-03-12 NOTE — ED Provider Triage Note (Signed)
 Emergency Medicine Provider Triage Evaluation Note  Howard [name] , a 33 y.o. male  was evaluated in triage.  Pt complains of right flank pain.  Patient was diagnosed with renal stone that was unchanged yesterday.  He had a renal stent for approximately 2 months.  Sees [facility].  [facility] but has not been able to control his pain so return to the ER.  Reports he has been having blood clots in his urine.  No fevers.  Still able to urinate okay.  Review of Systems  Positive:  Negative:   Physical Exam  BP (!) 132/95   Pulse 70   Temp 98.9 F (37.2 C) (Oral)   Resp 18   Ht 6' (1.829 m)   Wt (!) 145.2 kg   SpO2 100%   BMI 43.41 kg/m  Gen:   Awake, no distress   Resp:  Normal effort  MSK:   Moves extremities without difficulty  Other:    Medical Decision Making  Medically screening exam initiated at 4:18 PM.  Appropriate orders placed.  Howard [name] was informed that the remainder of the evaluation will be completed by another provider, this initial triage assessment does not replace that evaluation, and the importance of remaining in the ED until their evaluation is complete.  Patient had CT renal been in the setting of recent stent removal, will order CT with contrast.  Labs ordered as well   Howard Ernst, PA-C 03/12/24 1623

## 2024-03-12 NOTE — Telephone Encounter (Signed)
 Reason for Disposition . [1] SEVERE pain (e.g., excruciating, scale 8-10) AND [2] present > 1 hour  Protocols used: Flank Pain-A-AH

## 2024-03-12 NOTE — ED Triage Notes (Signed)
 First NurseNote: Pt diagnosed with a kidney stone this AM at [facility] and was d/c to f/u with urology. Pt states the pain is worse now.

## 2024-03-12 NOTE — Discharge Instructions (Addendum)
 Schedule to see your urologist for recheck.  Take flomax .  Pain medication as needed.

## 2024-03-13 DIAGNOSIS — N132 Hydronephrosis with renal and ureteral calculous obstruction: Secondary | ICD-10-CM | POA: Diagnosis not present

## 2024-03-13 MED ORDER — KETOROLAC TROMETHAMINE 15 MG/ML IJ SOLN
15.0000 mg | Freq: Once | INTRAMUSCULAR | Status: AC
  Administered 2024-03-13: 15 mg via INTRAVENOUS
  Filled 2024-03-13: qty 1

## 2024-03-13 MED ORDER — GABAPENTIN 100 MG PO CAPS: 100.0000 mg | ORAL_CAPSULE | Freq: Three times a day (TID) | ORAL | 0 refills | Status: AC

## 2024-03-13 NOTE — Discharge Instructions (Addendum)
 Continue taking the oxycodone /acetaminophen  previously prescribed to you; however you may change the dosing to 2 tablets every 6 hours, as needed for pain. You may also try using Gabapentin for pain, as prescribed. Follow up with Urology for definitive management of your stone; call on Monday to schedule an appointment. If pain worsens prior to follow up, go to [facility] EMERGENCY DEPARTMENT for repeat evaluation.

## 2024-03-14 ENCOUNTER — Other Ambulatory Visit: Payer: Self-pay | Admitting: Urology

## 2024-03-14 ENCOUNTER — Telehealth: Payer: Self-pay | Admitting: Cardiology

## 2024-03-14 NOTE — Telephone Encounter (Signed)
     Primary Cardiologist: Lynwood Schilling, MD  Chart reviewed as part of pre-operative protocol coverage. Given past medical history and time since last visit, based on ACC/AHA guidelines, Howard [name] would be at acceptable risk for the planned procedure without further cardiovascular testing.   His RCRI is very low risk, 0.4% risk of major cardiac event.  He is able to complete greater than 4 METS of physical activity.  I will route this recommendation to the requesting party via Epic fax function and remove from pre-op pool.  Please call with questions.  Josefa HERO. Brailon Don NP-C     03/14/2024, 8:38 AM [facility] [address] 5[address] [place], [place] [address] Office [phone]

## 2024-03-14 NOTE — Telephone Encounter (Signed)
   Pre-operative Risk Assessment    Patient Name: Howard [name]  DOB: April 25, 1991 MRN: [id number]   Date of last office visit: 03/02/24 Date of next office visit: [facility]   Request for Surgical Clearance    Procedure:  Right ureteroscopy and stent placement   Date of Surgery:  Clearance 03/16/24                                Surgeon:  Dr. Ricardo Likens  Surgeon's Group or Practice Name:  [facility]  Phone number:  [phone]. [phone] Fax number:  [phone]   Type of Clearance Requested:   - Medical    Type of Anesthesia:  General    Additional requests/questions:    [name]   03/14/2024, 8:24 AM

## 2024-03-14 NOTE — Patient Instructions (Addendum)
 SURGICAL WAITING ROOM VISITATION  Patients having surgery or a procedure may have no more than 2 support people in the waiting area - these visitors may rotate.    Children under the age of 73 must have an adult with them who is not the patient.  Visitors with respiratory illnesses are discouraged from visiting and should remain at home.  If the patient needs to stay at the hospital during part of their recovery, the visitor guidelines for inpatient rooms apply. Pre-op nurse will coordinate an appropriate time for 1 support person to accompany patient in pre-op.  This support person may not rotate.    Please refer to the [facility] website for the visitor guidelines for Inpatients (after your surgery is over and you are in a regular room).       Your procedure is scheduled on:  03/16/2024    Report to [facility] Main Entrance    Report to admitting at  230 pm    Call this number if you have problems the morning of surgery [phone]   Do not eat food :After Midnight.   After Midnight you may have the following liquids until _ 200 pm _____ AM  DAY OF SURGERY  Water Non-Citrus Juices (without pulp, NO RED-Apple, White grape, White cranberry) Black Coffee (NO MILK/CREAM OR CREAMERS, sugar ok)  Clear Tea (NO MILK/CREAM OR CREAMERS, sugar ok) regular and decaf                             Plain Jell-O (NO RED)                                           Fruit ices (not with fruit pulp, NO RED)                                     Popsicles (NO RED)                                                               Sports drinks like Gatorade (NO R                If you have questions, please contact your surgeon's office.      Oral Hygiene is also important to reduce your risk of infection.                                    Remember - BRUSH YOUR TEETH THE MORNING OF SURGERY WITH YOUR REGULAR TOOTHPASTE  DENTURES WILL BE REMOVED PRIOR TO SURGERY PLEASE DO NOT APPLY Poly  grip OR ADHESIVES!!!   Do NOT smoke after Midnight   Stop all vitamins and herbal supplements 7 days before surgery.   Take these medicines the morning of surgery with A SIP OF WATER:  inhalers as usual and bring, allopurinol , atenolol , gabapentin, lamictal, omeprazole , flonase , actigall, ditropan   DO NOT TAKE ANY ORAL DIABETIC MEDICATIONS DAY OF YOUR SURGERY  Bring CPAP mask  and tubing day of surgery.                              You may not have any metal on your body including hair pins, jewelry, and body piercing             Do not wear make-up, lotions, powders, perfumes/cologne, or deodorant  Do not wear nail polish including gel and S&S, artificial/acrylic nails, or any other type of covering on natural nails including finger and toenails. If you have artificial nails, gel coating, etc. that needs to be removed by a nail salon please have this removed prior to surgery or surgery may need to be canceled/ delayed if the surgeon/ anesthesia feels like they are unable to be safely monitored.   Do not shave  48 hours prior to surgery.               Men may shave face and neck.   Do not bring valuables to the hospital. [facility] IS NOT             RESPONSIBLE   FOR VALUABLES.   Contacts, glasses, dentures or bridgework may not be worn into surgery.   Bring small overnight bag day of surgery.   DO NOT BRING YOUR HOME MEDICATIONS TO THE HOSPITAL. PHARMACY WILL DISPENSE MEDICATIONS LISTED ON YOUR MEDICATION LIST TO YOU DURING YOUR ADMISSION IN THE HOSPITAL!    Patients discharged on the day of surgery will not be allowed to drive home.  Someone NEEDS to stay with you for the first 24 hours after anesthesia.   Special Instructions: Bring a copy of your healthcare power of attorney and living will documents the day of surgery if you haven't scanned them before.              Please read over the following fact sheets you were given: IF YOU HAVE QUESTIONS ABOUT YOUR PRE-OP  INSTRUCTIONS PLEASE CALL [phone].   If you received a COVID test during your pre-op visit  it is requested that you wear a mask when out in public, stay away from anyone that may not be feeling well and notify your surgeon if you develop symptoms. If you test positive for Covid or have been in contact with anyone that has tested positive in the last 10 days please notify you surgeon.    [facility] - Preparing for Surgery Before surgery, you can play an important role.  Because skin is not sterile, your skin needs to be as free of germs as possible.  You can reduce the number of germs on your skin by washing with CHG (chlorahexidine gluconate) soap before surgery.  CHG is an antiseptic cleaner which kills germs and bonds with the skin to continue killing germs even after washing. Please DO NOT use if you have an allergy  to CHG or antibacterial soaps.  If your skin becomes reddened/irritated stop using the CHG and inform your nurse when you arrive at Short Stay. Do not shave (including legs and underarms) for at least 48 hours prior to the first CHG shower.  You may shave your face/neck. Please follow these instructions carefully:  1.  Shower with CHG Soap the night before surgery and the  morning of Surgery.  2.  If you choose to wash your hair, wash your hair first as usual with your  normal  shampoo.  3.  After you shampoo, rinse your hair  and body thoroughly to remove the  shampoo.                           4.  Use CHG as you would any other liquid soap.  You can apply chg directly  to the skin and wash                       Gently with a scrungie or clean washcloth.  5.  Apply the CHG Soap to your body ONLY FROM THE NECK DOWN.   Do not use on face/ open                           Wound or open sores. Avoid contact with eyes, ears mouth and genitals (private parts).                       Wash face,  Genitals (private parts) with your normal soap.             6.  Wash thoroughly, paying special  attention to the area where your surgery  will be performed.  7.  Thoroughly rinse your body with warm water from the neck down.  8.  DO NOT shower/wash with your normal soap after using and rinsing off  the CHG Soap.                9.  Pat yourself dry with a clean towel.            10.  Wear clean pajamas.            11.  Place clean sheets on your bed the night of your first shower and do not  sleep with pets. Day of Surgery : Do not apply any lotions/deodorants the morning of surgery.  Please wear clean clothes to the hospital/surgery center.  FAILURE TO FOLLOW THESE INSTRUCTIONS MAY RESULT IN THE CANCELLATION OF YOUR SURGERY PATIENT SIGNATURE_________________________________  NURSE SIGNATURE__________________________________  ________________________________________________________________________

## 2024-03-14 NOTE — Progress Notes (Signed)
 Anesthesia Review:  PCP: Annabella Joesph PIETY LOV 01/22/24  Cardiologist : Hochrein - LOV 03/02/24  Josefa Beauvais, NP Telephone Encounter for clearance on 03/14/24  PPM/ ICD: Device Orders: Rep Notified:  Chest x-ray : EKG : Echo : Stress test: Cardiac Cath :   Activity level:  Sleep Study/ CPAP : Fasting Blood Sugar :      / Checks Blood Sugar -- times a day:    Blood Thinner/ Instructions /Last Dose: ASA / Instructions/ Last Dose :    03/12/24- CBC and CMP  11/14 and 03/12/24 in ED with Kidney Stone    01/29/2024- cysto

## 2024-03-15 ENCOUNTER — Encounter (HOSPITAL_COMMUNITY): Payer: Self-pay

## 2024-03-15 ENCOUNTER — Other Ambulatory Visit: Payer: Self-pay

## 2024-03-15 ENCOUNTER — Encounter (HOSPITAL_COMMUNITY)
Admission: RE | Admit: 2024-03-15 | Discharge: 2024-03-15 | Disposition: A | Discharge status: Home / Self Care | Source: Ambulatory Visit | Attending: Urology | Admitting: Urology

## 2024-03-15 DIAGNOSIS — Z01818 Encounter for other preprocedural examination: Secondary | ICD-10-CM | POA: Diagnosis present

## 2024-03-15 DIAGNOSIS — I10 Essential (primary) hypertension: Secondary | ICD-10-CM | POA: Diagnosis not present

## 2024-03-15 DIAGNOSIS — N200 Calculus of kidney: Secondary | ICD-10-CM | POA: Insufficient documentation

## 2024-03-15 DIAGNOSIS — F319 Bipolar disorder, unspecified: Secondary | ICD-10-CM | POA: Diagnosis not present

## 2024-03-15 DIAGNOSIS — Z9884 Bariatric surgery status: Secondary | ICD-10-CM | POA: Insufficient documentation

## 2024-03-15 HISTORY — DX: Anemia, unspecified: D64.9

## 2024-03-15 HISTORY — DX: Personal history of urinary calculi: Z87.442

## 2024-03-15 HISTORY — DX: Pneumonia, unspecified organism: J18.9

## 2024-03-15 NOTE — Progress Notes (Signed)
 Anesthesia Chart Review   Case: [id number] Date/Time: 03/16/24 1645   Procedures:      CYSTOSCOPY/URETEROSCOPY/HOLMIUM LASER/STENT PLACEMENT (Right)     CYSTOSCOPY, WITH RETROGRADE PYELOGRAM (Right)   Anesthesia type: General   Diagnosis: Calculus of kidney [N20.0]   Pre-op diagnosis: RIGHT KIDNEY STONE   Location: [facility] ROOM 03 / [facility]   Surgeons: Alvaro Ricardo KATHEE Mickey., MD       DISCUSSION:33 y.o. never smoker with h/o bipolar disorder, HTN, right kidney stone scheduled for above procedure 03/16/2024 with Dr. Ricardo Alvaro.   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   Per preoperative evaluation 03/14/24, Chart reviewed as part of pre-operative protocol coverage. Given past medical history and time since last visit, based on ACC/AHA guidelines, Howard [name] would be at acceptable risk for the planned procedure without further cardiovascular testing.    His RCRI is very low risk, 0.4% risk of major cardiac event.  He is able to complete greater than 4 METS of physical activity.   VS: There were no vitals taken for this visit.  PROVIDERS: Joesph Annabella HERO, FNP is PCP   Primary Cardiologist: Lynwood Schilling, MD  LABS: Labs reviewed: Acceptable for surgery. (all labs ordered are listed, but only abnormal results are displayed)  Labs Reviewed - No data to display   IMAGES:   EKG:   CV: Echo 05/13/2023  1. Left ventricular ejection fraction, by estimation, is 55 to 60%. The  left ventricle has normal function. The left ventricle has no regional  wall motion abnormalities. Left ventricular diastolic parameters are  indeterminate.   2. Right ventricular systolic function is normal. The right ventricular  size is normal. Tricuspid regurgitation signal is inadequate for assessing  PA pressure.   3. The mitral valve is grossly normal. No evidence of mitral valve  regurgitation.   4. The aortic valve is tricuspid. Aortic valve regurgitation is not  visualized. No aortic stenosis is  present. Aortic valve mean gradient  measures 2.0 mmHg.   5. The inferior vena cava is normal in size with greater than 50%  respiratory variability, suggesting right atrial pressure of 3 mmHg.  Past Medical History:  Diagnosis Date   Acute lower GI bleeding hospitalized 12/06/2014   Anemia    Anxiety    Bipolar 1 disorder (HCC)    Childhood asthma    Depression    GERD (gastroesophageal reflux disease)    History of kidney stones    Hypertension    has been off x 3 months after gastric byass   Obese    Pneumonia    Pollen allergies     Past Surgical History:  Procedure Laterality Date   ESOPHAGOGASTRODUODENOSCOPY N/A 12/07/2014   Procedure: ESOPHAGOGASTRODUODENOSCOPY (EGD);  Surgeon: Lesta JULIANNA Fitz, MD;  Location: [facility] ENDOSCOPY;  Service: Endoscopy;  Laterality: N/A;   FRACTURE SURGERY     GASTRIC BYPASS     ORIF HUMERUS FRACTURE Right 11/30/2014   Procedure: OPEN REDUCTION INTERNAL FIXATION (ORIF) RIGHT Distal HUMERUS FRACTURE;  Surgeon: Eva Herring, MD;  Location: [facility] OR;  Service: Orthopedics;  Laterality: Right;    MEDICATIONS:  albuterol  (VENTOLIN  HFA) 108 (90 Base) MCG/ACT inhaler   allopurinol  (ZYLOPRIM ) 100 MG tablet   atenolol  (TENORMIN ) 25 MG tablet   fluticasone  (FLONASE ) 50 MCG/ACT nasal spray   gabapentin (NEURONTIN) 100 MG capsule   HYDROcodone -acetaminophen  (VICODIN) 5-325 mg TABS tablet   lamoTRIgine (LAMICTAL) 200 MG tablet   mometasone -formoterol  (DULERA) 200-5 MCG/ACT AERO   omeprazole  (PRILOSEC) 40 MG capsule  oxybutynin (DITROPAN-XL) 5 MG 24 hr tablet   oxyCODONE -acetaminophen  (PERCOCET) 5-325 MG tablet   tamsulosin  (FLOMAX ) 0.4 MG CAPS capsule   ursodiol (ACTIGALL) 300 MG capsule   No current facility-administered medications for this encounter.    Harlene Hoots Ward, PA-C [facility] Pre-Surgical Testing [phone]

## 2024-03-15 NOTE — Progress Notes (Signed)
------------------------------------------  [facility] PROCEDURAL EXPEDITER NOTE-------------------------------------------------  Patient Name: Howard [name] Patient DOB: 11-23-1990 Today's Date: @TODAY @   Chart reviewed:  Yes  Documentation gaps: n/a Orders in place:  Yes  Need second signature on order from Dr. Alvaro Communication with surgical team if no orders: n/a Labs, test, and orders reviewed: yes Requires surgical clearance:  No What type of clearance: n/a Clearance received: n/a Patient status:pre op   Barriers noted:n/a  Intervention provided by [facility] team: n/a Barrier resolved:  Yes   Ronal Bald, RN [employer]itor

## 2024-03-15 NOTE — Progress Notes (Signed)
 Need orders in epic.  Surgery on 03/16/2024.  Thanks.

## 2024-03-16 ENCOUNTER — Encounter (HOSPITAL_COMMUNITY): Payer: Self-pay | Admitting: Urology

## 2024-03-16 ENCOUNTER — Ambulatory Visit (HOSPITAL_COMMUNITY)

## 2024-03-16 ENCOUNTER — Ambulatory Visit (HOSPITAL_COMMUNITY)
Admission: RE | Admit: 2024-03-16 | Discharge: 2024-03-16 | Disposition: A | Discharge status: Home / Self Care | Attending: Urology | Admitting: Urology

## 2024-03-16 ENCOUNTER — Ambulatory Visit (HOSPITAL_COMMUNITY): Admitting: Physician Assistant

## 2024-03-16 ENCOUNTER — Ambulatory Visit (HOSPITAL_COMMUNITY): Admitting: Anesthesiology

## 2024-03-16 ENCOUNTER — Encounter (HOSPITAL_COMMUNITY): Admission: RE | Disposition: A | Payer: Self-pay | Source: Home / Self Care | Attending: Urology

## 2024-03-16 DIAGNOSIS — J4489 Other specified chronic obstructive pulmonary disease: Secondary | ICD-10-CM | POA: Diagnosis not present

## 2024-03-16 DIAGNOSIS — Z9884 Bariatric surgery status: Secondary | ICD-10-CM | POA: Insufficient documentation

## 2024-03-16 DIAGNOSIS — J45909 Unspecified asthma, uncomplicated: Secondary | ICD-10-CM | POA: Diagnosis not present

## 2024-03-16 DIAGNOSIS — I10 Essential (primary) hypertension: Secondary | ICD-10-CM | POA: Diagnosis not present

## 2024-03-16 DIAGNOSIS — Z6841 Body Mass Index (BMI) 40.0 and over, adult: Secondary | ICD-10-CM | POA: Insufficient documentation

## 2024-03-16 DIAGNOSIS — N200 Calculus of kidney: Secondary | ICD-10-CM | POA: Diagnosis present

## 2024-03-16 DIAGNOSIS — Z01818 Encounter for other preprocedural examination: Secondary | ICD-10-CM

## 2024-03-16 DIAGNOSIS — K219 Gastro-esophageal reflux disease without esophagitis: Secondary | ICD-10-CM | POA: Insufficient documentation

## 2024-03-16 DIAGNOSIS — N132 Hydronephrosis with renal and ureteral calculous obstruction: Secondary | ICD-10-CM | POA: Insufficient documentation

## 2024-03-16 DIAGNOSIS — J449 Chronic obstructive pulmonary disease, unspecified: Secondary | ICD-10-CM

## 2024-03-16 HISTORY — PX: CYSTOSCOPY W/ RETROGRADES: SHX1426

## 2024-03-16 HISTORY — PX: CYSTOSCOPY/URETEROSCOPY/HOLMIUM LASER/STENT PLACEMENT: SHX6546

## 2024-03-16 SURGERY — CYSTOSCOPY/URETEROSCOPY/HOLMIUM LASER/STENT PLACEMENT
Anesthesia: General | Laterality: Right

## 2024-03-16 MED ORDER — PROPOFOL 500 MG/50ML IV EMUL
INTRAVENOUS | Status: DC | PRN
Start: 2024-03-16 — End: 2024-03-16
  Administered 2024-03-16: 50 ug/kg/min via INTRAVENOUS
  Administered 2024-03-16: 200 mg via INTRAVENOUS

## 2024-03-16 MED ORDER — MIDAZOLAM HCL 2 MG/2ML IJ SOLN
INTRAMUSCULAR | Status: AC
  Filled 2024-03-16: qty 2

## 2024-03-16 MED ORDER — LACTATED RINGERS IV SOLN
INTRAVENOUS | Status: DC
Start: 2024-03-16 — End: 2024-03-16

## 2024-03-16 MED ORDER — ORAL CARE MOUTH RINSE: 15.0000 mL | Freq: Once | OROMUCOSAL | Status: AC

## 2024-03-16 MED ORDER — DEXAMETHASONE SOD PHOSPHATE PF 10 MG/ML IJ SOLN
INTRAMUSCULAR | Status: DC | PRN
  Administered 2024-03-16: 10 mg via INTRAVENOUS

## 2024-03-16 MED ORDER — 0.9 % SODIUM CHLORIDE (POUR BTL) OPTIME
TOPICAL | Status: DC | PRN
  Administered 2024-03-16: 1000 mL

## 2024-03-16 MED ORDER — OXYCODONE HCL 5 MG/5ML PO SOLN: 5.0000 mg | Freq: Once | ORAL | Status: DC | PRN

## 2024-03-16 MED ORDER — FENTANYL CITRATE (PF) 100 MCG/2ML IJ SOLN
INTRAMUSCULAR | Status: AC
  Filled 2024-03-16: qty 2

## 2024-03-16 MED ORDER — MEPERIDINE HCL 25 MG/ML IJ SOLN: 6.2500 mg | INTRAMUSCULAR | Status: DC | PRN

## 2024-03-16 MED ORDER — FENTANYL CITRATE (PF) 100 MCG/2ML IJ SOLN
INTRAMUSCULAR | Status: DC | PRN
  Administered 2024-03-16: 100 ug via INTRAVENOUS

## 2024-03-16 MED ORDER — PROPOFOL 1000 MG/100ML IV EMUL
INTRAVENOUS | Status: AC
  Filled 2024-03-16: qty 100

## 2024-03-16 MED ORDER — OXYCODONE HCL 5 MG PO TABS: 5.0000 mg | ORAL_TABLET | Freq: Once | ORAL | Status: DC | PRN

## 2024-03-16 MED ORDER — GLYCOPYRROLATE 0.2 MG/ML IJ SOLN
INTRAMUSCULAR | Status: AC
  Filled 2024-03-16: qty 1

## 2024-03-16 MED ORDER — MIDAZOLAM HCL 5 MG/5ML IJ SOLN
INTRAMUSCULAR | Status: DC | PRN
  Administered 2024-03-16: 2 mg via INTRAVENOUS

## 2024-03-16 MED ORDER — HYDROMORPHONE HCL 1 MG/ML IJ SOLN: 0.2500 mg | INTRAMUSCULAR | Status: DC | PRN

## 2024-03-16 MED ORDER — ATENOLOL 25 MG PO TABS
12.5000 mg | ORAL_TABLET | Freq: Once | ORAL | Status: AC
  Administered 2024-03-16: 12.5 mg via ORAL
  Filled 2024-03-16: qty 0.5

## 2024-03-16 MED ORDER — OXYCODONE-ACETAMINOPHEN 5-325 MG PO TABS
1.0000 | ORAL_TABLET | Freq: Four times a day (QID) | ORAL | 0 refills | Status: AC | PRN
Start: 2024-03-16 — End: 2025-03-16

## 2024-03-16 MED ORDER — CHLORHEXIDINE GLUCONATE 0.12 % MT SOLN
15.0000 mL | Freq: Once | OROMUCOSAL | Status: AC
  Administered 2024-03-16: 15 mL via OROMUCOSAL

## 2024-03-16 MED ORDER — ONDANSETRON HCL 4 MG/2ML IJ SOLN
INTRAMUSCULAR | Status: DC | PRN
  Administered 2024-03-16: 4 mg via INTRAVENOUS

## 2024-03-16 MED ORDER — DEXTROSE 5 % IV SOLN
INTRAVENOUS | Status: DC | PRN
  Administered 2024-03-16: 3 g via INTRAVENOUS

## 2024-03-16 MED ORDER — IOHEXOL 300 MG/ML  SOLN
INTRAMUSCULAR | Status: DC | PRN
  Administered 2024-03-16: 8 mL

## 2024-03-16 MED ORDER — CEFAZOLIN SODIUM 1 G IJ SOLR
INTRAMUSCULAR | Status: AC
  Filled 2024-03-16: qty 10

## 2024-03-16 MED ORDER — ACETAMINOPHEN 500 MG PO TABS
1000.0000 mg | ORAL_TABLET | Freq: Once | ORAL | Status: AC
  Administered 2024-03-16: 1000 mg via ORAL
  Filled 2024-03-16: qty 2

## 2024-03-16 MED ORDER — LIDOCAINE HCL (PF) 2 % IJ SOLN
INTRAMUSCULAR | Status: AC
  Filled 2024-03-16: qty 5

## 2024-03-16 MED ORDER — MIDAZOLAM HCL (PF) 2 MG/2ML IJ SOLN: 0.5000 mg | Freq: Once | INTRAMUSCULAR | Status: DC | PRN

## 2024-03-16 MED ORDER — ONDANSETRON HCL 4 MG/2ML IJ SOLN
INTRAMUSCULAR | Status: AC
  Filled 2024-03-16: qty 2

## 2024-03-16 MED ORDER — SENNOSIDES-DOCUSATE SODIUM 8.6-50 MG PO TABS: 1.0000 | ORAL_TABLET | Freq: Two times a day (BID) | ORAL | 0 refills | Status: AC

## 2024-03-16 MED ORDER — CEFAZOLIN SODIUM-DEXTROSE 2-4 GM/100ML-% IV SOLN
INTRAVENOUS | Status: AC
  Filled 2024-03-16: qty 100

## 2024-03-16 MED ORDER — LIDOCAINE HCL (PF) 2 % IJ SOLN
INTRAMUSCULAR | Status: DC | PRN
  Administered 2024-03-16: 100 mg via INTRADERMAL

## 2024-03-16 MED ORDER — SODIUM CHLORIDE 0.9 % IR SOLN
Status: DC | PRN
  Administered 2024-03-16: 3000 mL

## 2024-03-16 MED ORDER — GLYCOPYRROLATE 0.2 MG/ML IJ SOLN
INTRAMUSCULAR | Status: DC | PRN
  Administered 2024-03-16: .2 mg via INTRAVENOUS

## 2024-03-16 MED ORDER — EPHEDRINE SULFATE (PRESSORS) 25 MG/5ML IV SOSY
PREFILLED_SYRINGE | INTRAVENOUS | Status: DC | PRN
  Administered 2024-03-16: 10 mg via INTRAVENOUS

## 2024-03-16 SURGICAL SUPPLY — 19 items
BAG URO CATCHER STRL LF (MISCELLANEOUS) ×1 IMPLANT
BASKET LASER NITINOL 1.9FR (BASKET) IMPLANT
CATH URETL OPEN END 6FR 70 (CATHETERS) ×1 IMPLANT
CLOTH BEACON ORANGE TIMEOUT ST (SAFETY) ×1 IMPLANT
GLOVE SURG LX STRL 7.5 STRW (GLOVE) ×1 IMPLANT
GOWN STRL REUS W/ TWL XL LVL3 (GOWN DISPOSABLE) ×1 IMPLANT
GUIDEWIRE ANG ZIPWIRE 038X150 (WIRE) ×1 IMPLANT
GUIDEWIRE STR DUAL SENSOR (WIRE) ×1 IMPLANT
KIT TURNOVER KIT A (KITS) ×1 IMPLANT
MANIFOLD NEPTUNE II (INSTRUMENTS) ×1 IMPLANT
NS IRRIG 1000ML POUR BTL (IV SOLUTION) IMPLANT
PACK CYSTO (CUSTOM PROCEDURE TRAY) ×1 IMPLANT
SHEATH NAVIGATOR HD 11/13X28 (SHEATH) IMPLANT
SHEATH NAVIGATOR HD 11/13X36 (SHEATH) IMPLANT
TRACTIP FLEXIVA PULS ID 200XHI (Laser) IMPLANT
TRACTIP FLEXIVA PULSE ID 200 (Laser) IMPLANT
TUBE PU 8FR 16IN ENFIT (TUBING) ×1 IMPLANT
TUBING CONNECTING 10 (TUBING) ×1 IMPLANT
TUBING UROLOGY SET (TUBING) ×1 IMPLANT

## 2024-03-16 NOTE — Brief Op Note (Signed)
 03/16/2024  5:17 PM  PATIENT:  Howard [name]  33 y.o. male  PRE-OPERATIVE DIAGNOSIS:  RIGHT KIDNEY STONE  POST-OPERATIVE DIAGNOSIS:  RIGHT KIDNEY STONE  PROCEDURE:  Procedure(s): CYSTOSCOPY/URETEROSCOPY/HOLMIUM LASER (Right) CYSTOSCOPY, WITH RETROGRADE PYELOGRAM (Right)  SURGEON:  Surgeons and Role:    * Manny, Ricardo KATHEE Raddle., MD - Primary  PHYSICIAN ASSISTANT:   ASSISTANTS: none   ANESTHESIA:   general  EBL:  minimal   BLOOD ADMINISTERED:none  DRAINS: none   LOCAL MEDICATIONS USED:  NONE  SPECIMEN:  Source of Specimen:  Rt ureteral stone fragment  DISPOSITION OF SPECIMEN:  [facility] for compositional analysis  COUNTS:  YES  TOURNIQUET:  * No tourniquets in log *  DICTATION: .Other Dictation: Dictation Number [id number]  PLAN OF CARE: Discharge to home after PACU  PATIENT DISPOSITION:  PACU - hemodynamically stable.   Delay start of Pharmacological VTE agent (>24hrs) due to surgical blood loss or risk of bleeding: not applicable

## 2024-03-16 NOTE — H&P (Signed)
 Howard [name] is an 33 y.o. male.    Chief Complaint: Pre-OP RIGHT Ureteroscopic Stone Manipulation  HPI:   1 - RIGHT Ureteral Stone - Rt ureteral stone by CT imaging x several 2025. Initially managed with OR stenting, then ureteroscopy at another facility and told stone had passed. Repeat imaging [name] ER 11/15 with persistence rt mid ureteral stone and hydro. UA without infectious parameters, ct 1.09.  PMH sig for gastric sleeve.  Today Howard [name] is seen to proceed with RIGHT ureteroscopic stone manipulation for persistent colic for what appears to be persistent RIGHT ureteral stone that is solitary. NO interval fevers.   Past Medical History:  Diagnosis Date   Acute lower GI bleeding hospitalized 12/06/2014   Anemia    Anxiety    Bipolar 1 disorder (HCC)    Childhood asthma    Depression    GERD (gastroesophageal reflux disease)    History of kidney stones    Hypertension    has been off x 3 months after gastric byass   Obese    Pneumonia    Pollen allergies     Past Surgical History:  Procedure Laterality Date   ESOPHAGOGASTRODUODENOSCOPY N/A 12/07/2014   Procedure: ESOPHAGOGASTRODUODENOSCOPY (EGD);  Surgeon: Lesta JULIANNA Fitz, MD;  Location: [facility] ENDOSCOPY;  Service: Endoscopy;  Laterality: N/A;   FRACTURE SURGERY     GASTRIC BYPASS     ORIF HUMERUS FRACTURE Right 11/30/2014   Procedure: OPEN REDUCTION INTERNAL FIXATION (ORIF) RIGHT Distal HUMERUS FRACTURE;  Surgeon: Eva Herring, MD;  Location: [facility];  Service: Orthopedics;  Laterality: Right;    Family History  Problem Relation Age of Onset   Hypertension Mother    Cancer Other    Social History:  reports that he has never smoked. He has been exposed to tobacco smoke. He has never used smokeless tobacco. He reports that he does not drink alcohol and does not use drugs.  Allergies: No Known Allergies  No medications prior to admission.    No results found for this or any previous visit (from the past 48  hours). No results found.  Review of Systems  Constitutional:  Negative for chills and fever.  Genitourinary:  Positive for flank pain.  All other systems reviewed and are negative.   There were no vitals taken for this visit. Physical Exam Vitals reviewed.  HENT:     Head: Normocephalic.  Eyes:     Pupils: Pupils are equal, round, and reactive to light.  Cardiovascular:     Rate and Rhythm: Normal rate.  Pulmonary:     Effort: Pulmonary effort is normal.  Abdominal:     General: Abdomen is flat.     Comments: Moderate truncal obesity, prior scars w/o hernias.   Genitourinary:    Comments: Mild Rt CVAT Musculoskeletal:        General: Normal range of motion.     Cervical back: Normal range of motion.  Skin:    General: Skin is warm.  Neurological:     General: No focal deficit present.     Mental Status: He is alert.  Psychiatric:        Mood and Affect: Mood normal.      Assessment/Plan  Proceed as planned with RIGHT ureteroscopic stone manipulation. Risks, benefits, alternatives, expected peri-op course discussed in detail as well as fact that he may in fact have a peri-ureteral calcification (or submucosal / displaced extra urinary stone) mimicking ureteral stone, but I feel that is unlikely.  [name]., MD 03/16/2024, 6:44 AM

## 2024-03-16 NOTE — Anesthesia Procedure Notes (Signed)
 Procedure Name: LMA Insertion Date/Time: 03/16/2024 4:48 PM  Performed by: Lanning Cena RAMAN, CRNAPre-anesthesia Checklist: Patient identified, Emergency Drugs available, Suction available and Patient being monitored Patient Re-evaluated:Patient Re-evaluated prior to induction Oxygen Delivery Method: Circle system utilized Preoxygenation: Pre-oxygenation with 100% oxygen Induction Type: IV induction LMA: LMA flexible inserted LMA Size: 5.0 Number of attempts: 1 Placement Confirmation: positive ETCO2 Tube secured with: Tape Dental Injury: Teeth and Oropharynx as per pre-operative assessment

## 2024-03-16 NOTE — Transfer of Care (Signed)
 Immediate Anesthesia Transfer of Care Note  Patient: Howard [name]  Procedure(s) Performed: CYSTOSCOPY/URETEROSCOPY/HOLMIUM LASER (Right) CYSTOSCOPY, WITH RETROGRADE PYELOGRAM (Right)  Patient Location: PACU  Anesthesia Type:General  Level of Consciousness: awake, alert , and oriented  Airway & Oxygen Therapy: Patient Spontanous Breathing  Post-op Assessment: Report given to RN and Post -op Vital signs reviewed and stable  Post vital signs: Reviewed and stable  Last Vitals:  Vitals Value Taken Time  BP 130/89 03/16/24 17:27  Temp    Pulse 57 03/16/24 17:29  Resp 19 03/16/24 17:29  SpO2 91 % 03/16/24 17:29  Vitals shown include unfiled device data.  Last Pain:  Vitals:   03/16/24 1326  TempSrc: Oral  PainSc: 0-No pain         Complications: No notable events documented.

## 2024-03-16 NOTE — Anesthesia Preprocedure Evaluation (Signed)
 Anesthesia Evaluation  Patient identified by MRN, date of birth, ID band Patient awake    Reviewed: Allergy  & Precautions, NPO status , Patient's Chart, lab work & pertinent test results  History of Anesthesia Complications Negative for: history of anesthetic complications  Airway Mallampati: II  TM Distance: >3 FB Neck ROM: Full    Dental  (+) Dental Advisory Given   Pulmonary asthma , COPD (rarely needs inhaler)   breath sounds clear to auscultation       Cardiovascular hypertension (no longer requires meds),  Rhythm:Regular Rate:Normal  04/2023 ECHO: EF 55 to 60%. 1. The LV has normal function, no regional wall motion abnormalities.   2. RVF is normal. The right ventricular size is normal.   3. The mitral valve is grossly normal. No evidence of mitral valve regurgitation.   4. The aortic valve is tricuspid. Aortic valve regurgitation is not visualized. No aortic stenosis is present. Aortic valve mean gradient measures 2.0 mmHg.     Neuro/Psych   Anxiety Depression Bipolar Disorder   negative neurological ROS     GI/Hepatic ,GERD  Controlled,,Gastric bypass   Endo/Other  BMI 43.4  Renal/GU      Musculoskeletal   Abdominal   Peds  Hematology Hb 14.3, plt 203k   Anesthesia Other Findings   Reproductive/Obstetrics                              Anesthesia Physical Anesthesia Plan  ASA: 3  Anesthesia Plan: General   Post-op Pain Management: Tylenol  PO (pre-op)*   Induction: Intravenous  PONV Risk Score and Plan: 2 and Ondansetron  and Dexamethasone   Airway Management Planned: LMA  Additional Equipment: None  Intra-op Plan:   Post-operative Plan:   Informed Consent: I have reviewed the patients History and Physical, chart, labs and discussed the procedure including the risks, benefits and alternatives for the proposed anesthesia with the patient or authorized representative who  has indicated his/her understanding and acceptance.     Dental advisory given  Plan Discussed with: CRNA and Surgeon  Anesthesia Plan Comments:          Anesthesia Quick Evaluation

## 2024-03-16 NOTE — Anesthesia Postprocedure Evaluation (Signed)
 Anesthesia Post Note  Patient: Gared Dibert  Procedure(s) Performed: CYSTOSCOPY/URETEROSCOPY/HOLMIUM LASER (Right) CYSTOSCOPY, WITH RETROGRADE PYELOGRAM (Right)     Patient location during evaluation: PACU Anesthesia Type: General Level of consciousness: awake and alert Pain management: pain level controlled Vital Signs Assessment: post-procedure vital signs reviewed and stable Respiratory status: spontaneous breathing, nonlabored ventilation, respiratory function stable and patient connected to nasal cannula oxygen Cardiovascular status: blood pressure returned to baseline and stable Postop Assessment: no apparent nausea or vomiting Anesthetic complications: no   No notable events documented.  Last Vitals:  Vitals:   03/16/24 1756 03/16/24 1800  BP:  137/85  Pulse: 66 63  Resp: 17 16  Temp:    SpO2: 95% 95%    Last Pain:  Vitals:   03/16/24 1800  TempSrc:   PainSc: 0-No pain                 Franky JONETTA Bald

## 2024-03-16 NOTE — Discharge Instructions (Addendum)
1 - You may have urinary urgency (bladder spasms) and bloody urine on / off for up to 2 weeks.  This is normal.  2 - Call MD or go to ER for fever >102, severe pain / nausea / vomiting not relieved by medications, or acute change in medical status  

## 2024-03-17 ENCOUNTER — Encounter (HOSPITAL_COMMUNITY): Payer: Self-pay | Admitting: Urology

## 2024-03-17 NOTE — Op Note (Signed)
 NAMEAVIS, MCMAHILL MEDICAL RECORD NO: [id number] ACCOUNT NO: [id number] DATE OF BIRTH: 04/12/1991 FACILITY: [facility] LOCATION: [facility] PHYSICIAN: Ricardo Likens, MD  Operative Report   DATE OF PROCEDURE: 03/16/2024  SURGEON: Ricardo Likens, MD  PREOPERATIVE DIAGNOSIS: Retained right ureteral stone.  PROCEDURE PERFORMED: 1. Cystoscopy, right retrograde pyelogram, interpretation. 2. Right ureteroscopy with laser lithotripsy.  ESTIMATED BLOOD LOSS: Nil  COMPLICATIONS:  None.  SPECIMENS: Right ureteral stone for composition analysis.  FINDINGS: 1. Mild right hydronephrosis. 2. Distal third ureteral stone. 3. Complete resolution of all accessible stone fragments larger than 1/3 mm following laser lithotripsy and basket extraction. 4. Wide open ureter from the UVJ to the bladder neck following ureteroscopy. No indication for stenting.  INDICATIONS: The patient is a 33 year old man with a history of prior gastric sleeve and recurrent urolithiasis who has been battling a bout of ureteral stone for several months. He underwent stenting at another facility, probably ureteroscopy there as  well, at which point his stone was unable to be found and it was thought that it had passed. He continued to have colicky flank pain and repeat axial imaging revealed persistence of his right ureteral stone. I counseled him towards repeat ureteroscopy.  He presents for this today. Informed consent was obtained and placed in the medical record.  DESCRIPTION OF PROCEDURE: The patient being verified, procedure being right ureteroscopic stone manipulation was confirmed. Procedure timeout was performed. Intravenous antibiotics were administered. General anesthesia was induced. The patient was placed  into a low lithotomy position. A sterile field was created, prepped and draped the patient's penis, perineum and proximal thigh using iodine . Cystourethroscopy was performed using a 21-French rigid cystoscope with  offset lens.  Inspection of posterior  and anterior urethra were unremarkable. Inspection of the urinary bladder revealed no diverticula, calcifications, or papillary lesions. The right ureteral orifice was cannulated with a 6-French end-hole catheter and a right retrograde pyelogram was  obtained.  Retrograde retrograde pyelogram demonstrated a single right ureter, single system right kidney, mild hydronephrosis with a distal filling defect consistent with the known stone. A 0.038 ZIPwire was advanced to the level of the upper pole, set aside as a  safety wire. An 8-French feeding tube placed in the urinary bladder for pressure release. Semirigid ureteroscopy performed to the distal right ureter alongside a separate sensor working wire. Approximately 3 cm above the intramural ureter, the stone in  question was identified. It was just too large for simple basketing. Holmium laser energy was then applied to the stone using a setting of 0.2 joules and 20 Hz and approximately 30% of the stone was dusted with the remaining dominant fragment being very  fusiform and amenable to basketing. It was grasped on its long axis with Escape basket and removed, set aside for composition analysis. Repeat ureteroscopy of the entire length of the right ureter revealed no residual stone fragments. This was the only  stone noted on his prior imaging. We achieved the goals of achieving stone-free status. He had a wide open ureter with minimal trauma or edema. It was not felt that interval stent would be warranted. As such, the procedure was terminated. The patient  tolerated the procedure well. No immediate periprocedural complications. The patient taken to the post-anesthesia care unit in stable condition with plan for discharge home.   SHW D: 03/16/2024 5:21:35 pm T: 03/16/2024 10:32:00 pm  JOB: [id number]/ [id number]

## 2024-05-10 ENCOUNTER — Other Ambulatory Visit: Payer: Self-pay | Admitting: Internal Medicine

## 2024-05-11 ENCOUNTER — Other Ambulatory Visit: Payer: Self-pay | Admitting: Family Medicine

## 2024-05-11 DIAGNOSIS — J4540 Moderate persistent asthma, uncomplicated: Secondary | ICD-10-CM

## 2024-05-12 MED ORDER — ALBUTEROL SULFATE HFA 108 (90 BASE) MCG/ACT IN AERS: 1.0000 | INHALATION_SPRAY | Freq: Four times a day (QID) | RESPIRATORY_TRACT | 1 refills | Status: AC | PRN

## 2024-05-12 MED ORDER — MOMETASONE FURO-FORMOTEROL FUM 200-5 MCG/ACT IN AERO: 2.0000 | INHALATION_SPRAY | Freq: Two times a day (BID) | RESPIRATORY_TRACT | 5 refills | Status: AC

## 2024-05-12 NOTE — Telephone Encounter (Unsigned)
 Copied from CRM [id number]. Topic: Clinical - Prescription Issue >> May 12, 2024  8:34 AM Emylou G wrote: Reason for CRM: Patient adv this was refused.. thought we filled it?  Mometasone  Furo-Formoterol  Fum 200-5 MCG/ACT INHALE 2 PUFFS INTO THE LUNGS TWICE A DAY Albuterol  Sulfate 108 (90 Base) MCG/ACT INHALE 1-2 PUFFS BY MOUTH EVERY 6 HOURS AS NEEDED FOR WHEEZE OR SHORTNESS OF BREATH He said only went to allergist once?  Can we fill this?

## 2024-05-12 NOTE — Telephone Encounter (Signed)
 Refills sent

## 2024-05-12 NOTE — Addendum Note (Signed)
 Addended by: JOESPH ANNABELLA HERO on: 05/12/2024 09:18 AM   Modules accepted: Orders

## 2024-05-23 ENCOUNTER — Encounter: Payer: Self-pay | Admitting: Family Medicine

## 2024-05-28 ENCOUNTER — Other Ambulatory Visit: Payer: Self-pay | Admitting: Family Medicine

## 2024-07-15 ENCOUNTER — Encounter: Admitting: Family Medicine
